# Patient Record
Sex: Male | Born: 1950 | Hispanic: No | Marital: Married | Smoking: Former smoker
Health system: Southern US, Community
[De-identification: ages and names within clinical notes are randomized; demographics above are authoritative.]

## PROBLEM LIST (undated history)

## (undated) DIAGNOSIS — U071 COVID-19: Secondary | ICD-10-CM

## (undated) DIAGNOSIS — E119 Type 2 diabetes mellitus without complications: Secondary | ICD-10-CM

## (undated) DIAGNOSIS — Z6831 Body mass index (BMI) 31.0-31.9, adult: Secondary | ICD-10-CM

## (undated) DIAGNOSIS — I10 Essential (primary) hypertension: Secondary | ICD-10-CM

---

## 2018-09-19 ENCOUNTER — Other Ambulatory Visit: Payer: Self-pay | Admitting: Physician Assistant

## 2018-09-19 DIAGNOSIS — Z87891 Personal history of nicotine dependence: Secondary | ICD-10-CM

## 2018-09-27 ENCOUNTER — Ambulatory Visit
Admission: RE | Admit: 2018-09-27 | Discharge: 2018-09-27 | Disposition: A | Discharge status: Home / Self Care | Payer: Medicaid Other | Source: Ambulatory Visit | Attending: Physician Assistant | Admitting: Physician Assistant

## 2018-09-27 DIAGNOSIS — Z87891 Personal history of nicotine dependence: Secondary | ICD-10-CM

## 2018-09-28 ENCOUNTER — Emergency Department (HOSPITAL_COMMUNITY): Payer: Medicaid Other

## 2018-09-28 ENCOUNTER — Emergency Department (HOSPITAL_COMMUNITY)
Admission: EM | Admit: 2018-09-28 | Discharge: 2018-09-29 | Disposition: A | Discharge status: Home / Self Care | Payer: Medicaid Other | Attending: Emergency Medicine | Admitting: Emergency Medicine

## 2018-09-28 ENCOUNTER — Encounter (HOSPITAL_COMMUNITY): Payer: Self-pay | Admitting: Emergency Medicine

## 2018-09-28 DIAGNOSIS — R51 Headache: Secondary | ICD-10-CM | POA: Diagnosis not present

## 2018-09-28 DIAGNOSIS — R519 Headache, unspecified: Secondary | ICD-10-CM

## 2018-09-28 DIAGNOSIS — Y999 Unspecified external cause status: Secondary | ICD-10-CM | POA: Insufficient documentation

## 2018-09-28 DIAGNOSIS — S0101XA Laceration without foreign body of scalp, initial encounter: Secondary | ICD-10-CM | POA: Diagnosis not present

## 2018-09-28 DIAGNOSIS — Y92009 Unspecified place in unspecified non-institutional (private) residence as the place of occurrence of the external cause: Secondary | ICD-10-CM | POA: Insufficient documentation

## 2018-09-28 DIAGNOSIS — I10 Essential (primary) hypertension: Secondary | ICD-10-CM | POA: Insufficient documentation

## 2018-09-28 DIAGNOSIS — W2209XA Striking against other stationary object, initial encounter: Secondary | ICD-10-CM | POA: Diagnosis not present

## 2018-09-28 DIAGNOSIS — E119 Type 2 diabetes mellitus without complications: Secondary | ICD-10-CM | POA: Diagnosis not present

## 2018-09-28 DIAGNOSIS — Z7984 Long term (current) use of oral hypoglycemic drugs: Secondary | ICD-10-CM | POA: Diagnosis not present

## 2018-09-28 DIAGNOSIS — Z87891 Personal history of nicotine dependence: Secondary | ICD-10-CM | POA: Insufficient documentation

## 2018-09-28 DIAGNOSIS — Y939 Activity, unspecified: Secondary | ICD-10-CM | POA: Insufficient documentation

## 2018-09-28 HISTORY — DX: Type 2 diabetes mellitus without complications: E11.9

## 2018-09-28 HISTORY — DX: Essential (primary) hypertension: I10

## 2018-09-28 NOTE — ED Triage Notes (Signed)
Reports hitting the left side of his head on the cabinet in his kitchen three days ago.  Was seen at [facility].  Reports they didn't give him any medicine and he is still having pain and can't sleep.  Small lac noted to left side of scalp that is well approximated and appears to be healing.

## 2018-09-28 NOTE — ED Provider Notes (Signed)
[facility] EMERGENCY DEPARTMENT Provider Note   CSN: [id number] Arrival date & time: 09/28/18  2216     History   Chief Complaint Chief Complaint  Patient presents with  . Head Laceration    HPI Dennis Mcdaniel is a 67 y.o. male with a hx of HTN, NIDDM presents to the Emergency Department complaining of gradual, persistent, headache onset 3 days ago after hitting his head on a cabinet. Described as stabbing and located only at the site of the injury without radiation.  He reports associated blurred vision in the left eye, nausea and dizziness (described as feeling lightheaded) onset today.  Associated symptoms include small laceration to the left scalp.  Pt cleaned wound with hydrogen peroxide at the time.  Pt reports he was seen at urgent care for this but was not given any pain medication.  No stitches placed.  Pt reports taking tylenol with complete resolution of headache, but then it returns when the medication wears off.  Pt is not on an anticoagulant.  Pt denies fever, chills, neck pain, neck stiffness, CP, SOB, abd pain, V/D, weakness, syncope, gait disturbance.       The history is provided by the patient and medical records. No language interpreter was used.    Past Medical History:  Diagnosis Date  . Diabetes mellitus without complication (HCC)   . Hypertension     There are no active problems to display for this patient.   History reviewed. No pertinent surgical history.      Home Medications    Prior to Admission medications   Medication Sig Start Date End Date Taking? Authorizing Provider  metFORMIN (GLUCOPHAGE) 500 MG tablet Take 500 mg by mouth 2 (two) times daily. 02/12/18  Yes [provider]    Family History No family history on file.  Social History Social History   Tobacco Use  . Smoking status: Former Games developer  . Smokeless tobacco: Never Used  Substance Use Topics  . Alcohol use: Never    Frequency: Never  .  Drug use: Never     Allergies   Pork allergy   Review of Systems Review of Systems  Constitutional: Negative for appetite change, diaphoresis, fatigue, fever and unexpected weight change.  HENT: Negative for mouth sores.   Eyes: Positive for visual disturbance.  Respiratory: Negative for cough, chest tightness, shortness of breath and wheezing.   Cardiovascular: Negative for chest pain.  Gastrointestinal: Positive for nausea. Negative for abdominal pain, constipation, diarrhea and vomiting.  Endocrine: Negative for polydipsia, polyphagia and polyuria.  Genitourinary: Negative for dysuria, frequency, hematuria and urgency.  Musculoskeletal: Negative for back pain and neck stiffness.  Skin: Positive for wound. Negative for rash.  Allergic/Immunologic: Negative for immunocompromised state.  Neurological: Positive for dizziness and headaches. Negative for syncope and light-headedness.  Hematological: Does not bruise/bleed easily.  Psychiatric/Behavioral: Negative for sleep disturbance. The patient is not nervous/anxious.      Physical Exam Updated Vital Signs BP 129/68 (BP Location: Right Arm)   Pulse 67   Temp 98.3 F (36.8 C) (Oral)   Resp 18   Ht 5\' 6"  (1.676 m)   Wt 81.2 kg   SpO2 99%   BMI 28.89 kg/m   Physical Exam  Constitutional: He is oriented to person, place, and time. He appears well-developed and well-nourished. No distress.  HENT:  Head: Normocephalic.  Mouth/Throat: Oropharynx is clear and moist.  1.0 cm laceration to the left occiput, healing without erythema, increased warmth, drainage from  the wound.   Eyes: Pupils are equal, round, and reactive to light. Conjunctivae and EOM are normal. No scleral icterus.  No horizontal, vertical or rotational nystagmus  Neck: Normal range of motion. Neck supple.  Full active and passive ROM without pain No midline or paraspinal tenderness No nuchal rigidity or meningeal signs  Cardiovascular: Normal rate, regular  rhythm and intact distal pulses.  Pulmonary/Chest: Effort normal and breath sounds normal. No respiratory distress. He has no wheezes. He has no rales.  Abdominal: Soft. Bowel sounds are normal. There is no tenderness. There is no rebound and no guarding.  Musculoskeletal: Normal range of motion.  Lymphadenopathy:    He has no cervical adenopathy.  Neurological: He is alert and oriented to person, place, and time. No cranial nerve deficit. He exhibits normal muscle tone. Coordination normal.  Mental Status:  Alert, oriented, thought content appropriate. Speech fluent without evidence of aphasia. Able to follow 2 step commands without difficulty.  Cranial Nerves:  II:  Peripheral visual fields grossly normal, pupils equal, round, reactive to light III,IV, VI: ptosis not present, extra-ocular motions intact bilaterally  V,VII: smile symmetric, facial light touch sensation equal VIII: hearing grossly normal bilaterally  IX,X: midline uvula rise  XI: bilateral shoulder shrug equal and strong XII: midline tongue extension  Motor:  5/5 in upper and lower extremities bilaterally including strong and equal grip strength and dorsiflexion/plantar flexion Sensory: Pinprick and light touch normal in all extremities.  Cerebellar: normal finger-to-nose with bilateral upper extremities Gait: normal gait and balance CV: distal pulses palpable throughout   Skin: Skin is warm and dry. No rash noted. He is not diaphoretic.  Psychiatric: He has a normal mood and affect. His behavior is normal. Judgment and thought content normal.  Nursing note and vitals reviewed.    ED Treatments / Results   Radiology Ct Head Wo Contrast  Result Date: 09/29/2018 CLINICAL DATA:  Head trauma, minor, GCS>=13, high clinical risk, initial exam. Blurred vision, nausea, dizziness. Struck left side of head cabinet 3 days ago. EXAM: CT HEAD WITHOUT CONTRAST TECHNIQUE: Contiguous axial images were obtained from the base of the  skull through the vertex without intravenous contrast. COMPARISON:  None. FINDINGS: Brain: Mild age related atrophy. No intracranial hemorrhage, mass effect, or midline shift. No hydrocephalus. The basilar cisterns are patent. No evidence of territorial infarct or acute ischemia. No extra-axial or intracranial fluid collection. Vascular: Hyperdense vessel. Skull: No fracture or focal lesion. Sinuses/Orbits: Mucosal thickening of the ethmoid air cells. Mucous retention cyst in the right maxillary sinus. No sinus fluid levels. Visualized orbits are unremarkable. Other: None. IMPRESSION: Normal for age atrophy. No acute intracranial abnormality. No skull fracture. Electronically Signed   By: Narda Rutherford M.D.   On: 09/29/2018 00:17   Procedures Procedures (including critical care time)  Medications Ordered in ED Medications  ketorolac (TORADOL) 30 MG/ML injection 30 mg (30 mg Intramuscular Given 09/29/18 0045)     Initial Impression / Assessment and Plan / ED Course  I have reviewed the triage vital signs and the nursing notes.  Pertinent labs & imaging results that were available during my care of the patient were reviewed by me and considered in my medical decision making (see chart for details).  Clinical Course as of Sep 29 150  Wynelle Link Sep 29, 2018  0101 Visual acuity: Bilateral Near: 10/16   R Near: 10/16  L Near: 10/20     [HM]    Clinical Course User Index [HM] Shyler Holzman, Dahlia Client,  PA-C    Pt presents with headache and laceration after minor head trauma.  He has had intermittent headache.  Normal neuro exam and pt ambulates with steady gait.  CT head without acute abnormality including ICH or skull fracture.  Pt reports blurred vision, but visual acuity is reassuring.  No nystagmus.  No diplopia.  Pt also with mild nausea and some dizziness.  Normal finger to nose.  No thunderclap headache.  Doubt SAH.  Pt is well appearing and headache improved with toradol.  Likely post concussive  nature.  Pt without hx of CKD or gastritis/PUD.  Will have patient alternate tylenol and motrin and follow-up with PCP in several days.    The patient was discussed with Dr. Judd Lien who agrees with the treatment plan.   Final Clinical Impressions(s) / ED Diagnoses   Final diagnoses:  Laceration of scalp, initial encounter  Left-sided headache    ED Discharge Orders    None       Milta Deiters 09/29/18 1610    Geoffery Lyons, MD 09/29/18 [phone]

## 2018-09-28 NOTE — ED Notes (Signed)
Taken to CT at this time. 

## 2018-09-29 MED ORDER — KETOROLAC TROMETHAMINE 30 MG/ML IJ SOLN
30.0000 mg | Freq: Once | INTRAMUSCULAR | Status: AC
  Administered 2018-09-29: 30 mg via INTRAMUSCULAR
  Filled 2018-09-29: qty 1

## 2018-09-29 NOTE — Discharge Instructions (Addendum)
1. Medications: alternate tylenol and ibuprofen, usual home medications 2. Treatment: rest, drink plenty of fluids,  3. Follow Up: Please followup with your primary doctor in 3-5 days for discussion of your diagnoses and further evaluation after today's visit; if you do not have a primary care doctor use the resource guide provided to find one; Please return to the ER for worsening symptoms, vomiting, changes in vision or other concerns

## 2019-04-14 ENCOUNTER — Encounter (HOSPITAL_COMMUNITY): Payer: Self-pay

## 2019-04-14 ENCOUNTER — Other Ambulatory Visit: Payer: Self-pay

## 2019-04-14 ENCOUNTER — Emergency Department (HOSPITAL_COMMUNITY)
Admission: EM | Admit: 2019-04-14 | Discharge: 2019-04-15 | Disposition: A | Discharge status: Home / Self Care | Payer: Medicaid Other | Attending: Emergency Medicine | Admitting: Emergency Medicine

## 2019-04-14 DIAGNOSIS — M546 Pain in thoracic spine: Secondary | ICD-10-CM | POA: Insufficient documentation

## 2019-04-14 DIAGNOSIS — Z87891 Personal history of nicotine dependence: Secondary | ICD-10-CM | POA: Diagnosis not present

## 2019-04-14 DIAGNOSIS — W11XXXA Fall on and from ladder, initial encounter: Secondary | ICD-10-CM | POA: Diagnosis not present

## 2019-04-14 DIAGNOSIS — I10 Essential (primary) hypertension: Secondary | ICD-10-CM | POA: Diagnosis not present

## 2019-04-14 DIAGNOSIS — M79671 Pain in right foot: Secondary | ICD-10-CM | POA: Insufficient documentation

## 2019-04-14 DIAGNOSIS — M545 Low back pain: Secondary | ICD-10-CM | POA: Insufficient documentation

## 2019-04-14 DIAGNOSIS — E119 Type 2 diabetes mellitus without complications: Secondary | ICD-10-CM | POA: Diagnosis not present

## 2019-04-14 DIAGNOSIS — Z7984 Long term (current) use of oral hypoglycemic drugs: Secondary | ICD-10-CM | POA: Diagnosis not present

## 2019-04-14 NOTE — ED Triage Notes (Signed)
Pt arrives to ED with c/o back pain due to fall at work on last Tuesday; pt a&ox 4 on arrival and able to [facility]

## 2019-04-15 ENCOUNTER — Emergency Department (HOSPITAL_COMMUNITY): Payer: Medicaid Other

## 2019-04-15 MED ORDER — OXYCODONE-ACETAMINOPHEN 5-325 MG PO TABS: 1.0000 | ORAL_TABLET | ORAL | 0 refills | Status: DC | PRN

## 2019-04-15 NOTE — ED Provider Notes (Signed)
[facility] Provider [facility]   CSN: [id number] Arrival date & time: 04/14/19  2137    History   Chief Complaint Chief Complaint  Patient presents with  . Back Pain  . Fall    HPI Dennis Mcdaniel is a 68 y.o. male.     The history is provided by the patient and medical records.  Back Pain  Fall      68 y.o. M with hx of HTN, DM, presenting to the ED after a fall that occurred 1 week ago.  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  States he slight hit his head on nearby shelf but no LOC.  States over the past week he has had progressively worsening low back pain, more so lower spine but also somewhat along mid-back and right foot.  He denies numbness/weakness of his arms/legs.  No bowel or bladder incontinence.  He states he is walking around, but causing him pain when doing so.  He has not been taking medications at home.  No prior history of back injuries or surgeries in the past.  No headaches, confusion, changes in speech, blurred vision, etc.  He is not on anticoagulation.  Past Medical History:  Diagnosis Date  . Diabetes mellitus without complication (HCC)   . Hypertension     There are no active problems to display for this patient.   History reviewed. No pertinent surgical history.      Home Medications    Prior to Admission medications   Medication Sig Start Date End Date Taking? Authorizing Provider  metFORMIN (GLUCOPHAGE) 500 MG tablet Take 500 mg by mouth 2 (two) times daily. 02/12/18   [provider]    Family History History reviewed. No pertinent family history.  Social History Social History   Tobacco Use  . Smoking status: Former Games developermoker  . Smokeless tobacco: Never Used  Substance Use Topics  . Alcohol use: Never    Frequency: Never  . Drug use: Never     Allergies   Pork allergy   Review of Systems Review of Systems  Musculoskeletal: Positive for back pain.     Physical Exam Updated Vital Signs BP (!) 159/92 (BP Location: Right Arm)   Pulse (!) 101   Temp 98.6 F (37 C) (Oral)   Resp 18   SpO2 97%   Physical Exam Vitals signs and nursing note reviewed.  Constitutional:      Appearance: He is well-developed.  HENT:     Head: Normocephalic and atraumatic.     Comments: No visible signs of head trauma Eyes:     Conjunctiva/sclera: Conjunctivae normal.     Pupils: Pupils are equal, round, and reactive to light.  Neck:     Musculoskeletal: Normal range of motion.  Cardiovascular:     Rate and Rhythm: Normal rate and regular rhythm.     Heart sounds: Normal heart sounds.  Pulmonary:     Effort: Pulmonary effort is normal.     Breath sounds: Normal breath sounds.  Abdominal:     General: Bowel sounds are normal.     Palpations: Abdomen is soft.  Musculoskeletal: Normal range of motion.     Comments: Tenderness of sacrum and lumbar spine, somewhat to include the right lumbar paraspinal region; there is no appreciable midline deformity or bruising Mild tenderness of right thoracic paraspinal region; no  acute deformity or bruising noted Right foot normal in appearance, reports pain along dorsum Extremity pulses intact x4, remains ambulatory with normal strength/sensation throughout  Skin:    General: Skin is warm and dry.  Neurological:     Mental Status: He is alert and oriented to person, place, and time.     Comments: AAOx3, answering questions and following commands appropriately; equal strength UE and LE bilaterally; CN grossly intact; moves all extremities appropriately without ataxia; no focal neuro deficits or facial asymmetry appreciated      ED Treatments / Results  Labs (all labs ordered are listed, but only abnormal results are displayed) Labs Reviewed - No data to display  EKG None  Radiology Dg Thoracic Spine 2 View  Result  Date: 04/15/2019 CLINICAL DATA:  Fall from ladder EXAM: THORACIC SPINE 2 VIEWS COMPARISON:  None. FINDINGS: There is no evidence of thoracic spine fracture. Alignment is normal. No other significant bone abnormalities are identified. IMPRESSION: Negative. Electronically Signed   By: Deatra Robinson M.D.   On: 04/15/2019 01:19   Dg Lumbar Spine Complete  Result Date: 04/15/2019 CLINICAL DATA:  Fall from ladder EXAM: LUMBAR SPINE - COMPLETE 4+ VIEW COMPARISON:  None. FINDINGS: There is no evidence of lumbar spine fracture. Alignment is normal. Intervertebral disc spaces are maintained. IMPRESSION: Negative. Electronically Signed   By: Deatra Robinson M.D.   On: 04/15/2019 01:20   Dg Sacrum/coccyx  Result Date: 04/15/2019 CLINICAL DATA:  Fall from ladder EXAM: SACRUM AND COCCYX - 2+ VIEW COMPARISON:  None. FINDINGS: There is no evidence of fracture or other focal bone lesions. IMPRESSION: Negative. Electronically Signed   By: Deatra Robinson M.D.   On: 04/15/2019 01:18   Dg Foot Complete Right  Result Date: 04/15/2019 CLINICAL DATA:  Fall from ladder EXAM: RIGHT FOOT COMPLETE - 3+ VIEW COMPARISON:  None. FINDINGS: There is no evidence of fracture or dislocation. There is no evidence of arthropathy or other focal bone abnormality. Soft tissues are unremarkable. IMPRESSION: Negative. Electronically Signed   By: Deatra Robinson M.D.   On: 04/15/2019 01:19    Procedures Procedures (including critical care time)  Medications Ordered in ED Medications - No data to display   Initial Impression / Assessment and Plan / ED Course  I have reviewed the triage vital signs and the nursing notes.  Pertinent labs & imaging results that were available during my care of the patient were reviewed by me and considered in my medical decision making (see chart for details).  68 y.o. M here after fall from 20ft ladder almost a week ago.  Patient states he lost his footing, fell backwards onto concrete floor.  Struck head  slightly against nearby shelf but no LOC.  Reports ongoing back in the tailbone, low and mid-back, and right foot.  Some areas of tenderness on exam but no acute deformities.  No visible signs of head trauma.  No focal neurologic deficits.  Remains ambulatory.  Plan for screening x-rays.  X-rays all negative.  Patient remains without focal neurologic deficits.  No red flag symptoms concerning for cauda equina.  Continues ambulating here in the ED.  Feel he is stable for discharge home with pain control.  Given work-note.  Follow-up with PCP.  Return here for any new/acute changes.  Final Clinical Impressions(s) / ED Diagnoses   Final diagnoses:  Fall from ladder, initial encounter    ED Discharge Orders         Ordered    oxyCODONE-acetaminophen (  PERCOCET) 5-325 MG tablet  Every 4 hours PRN     04/15/19 0149           Garlon Hatchet, PA-C 04/15/19 0448    Nira Conn, MD 04/15/19 [phone]

## 2019-04-15 NOTE — ED Notes (Signed)
Patient transported to X-ray 

## 2019-04-15 NOTE — ED Notes (Signed)
ED Provider at bedside. 

## 2019-04-15 NOTE — Discharge Instructions (Signed)
Take the prescribed medication as directed.  Do not drive or use heavy machinery while taking this medication.  Can take motrin between doses if needed for extra pain relief.  Can use ice/heat too. Follow-up with your primary care doctor. Return to the ED for new or worsening symptoms.

## 2019-07-08 IMAGING — CR SACRUM AND COCCYX - 2+ VIEW
3 series · 3 of 3 positions shown · non-contrast
Comparison: None.

CLINICAL DATA: Fall from ladder

EXAM:
SACRUM AND COCCYX - 2+ VIEW

[coccyx ap]
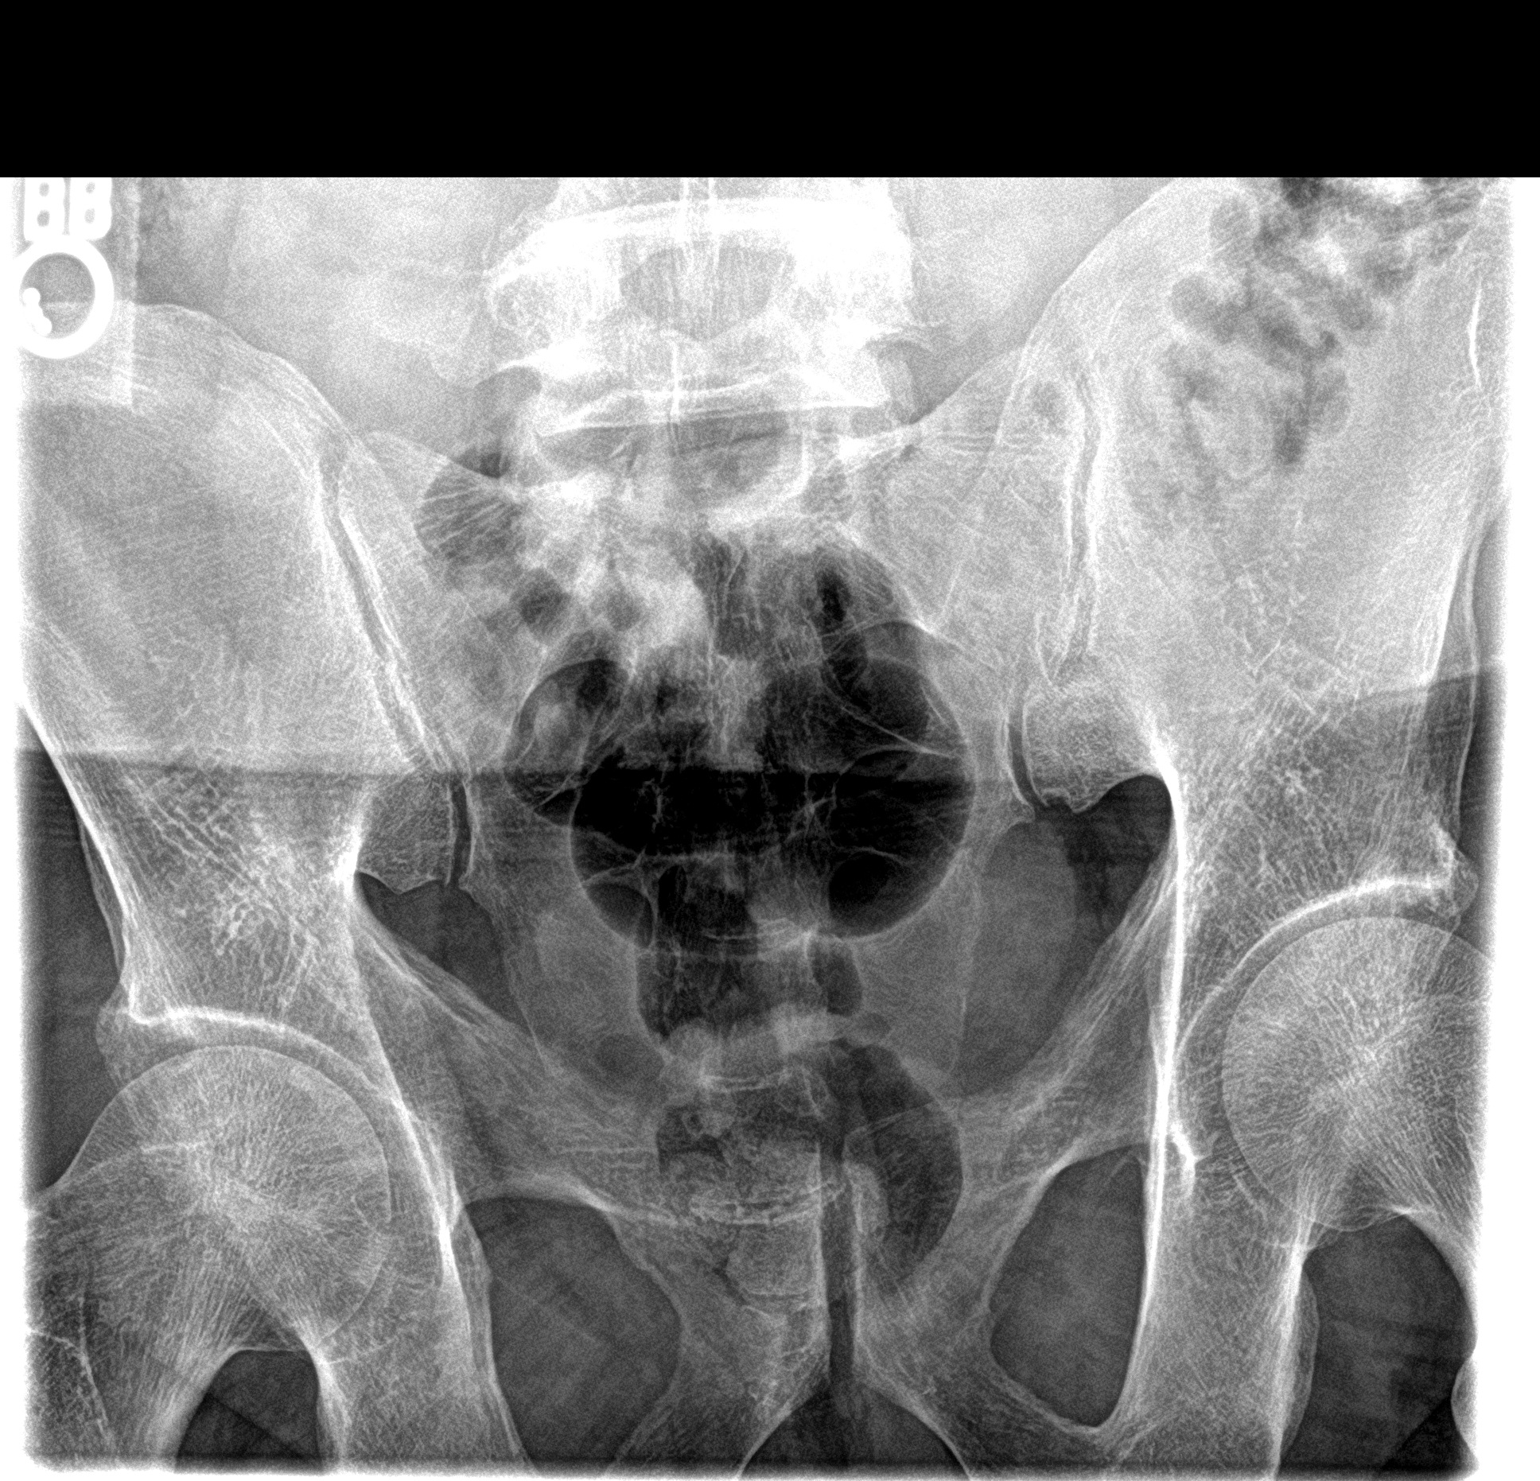

[sacrum ap]
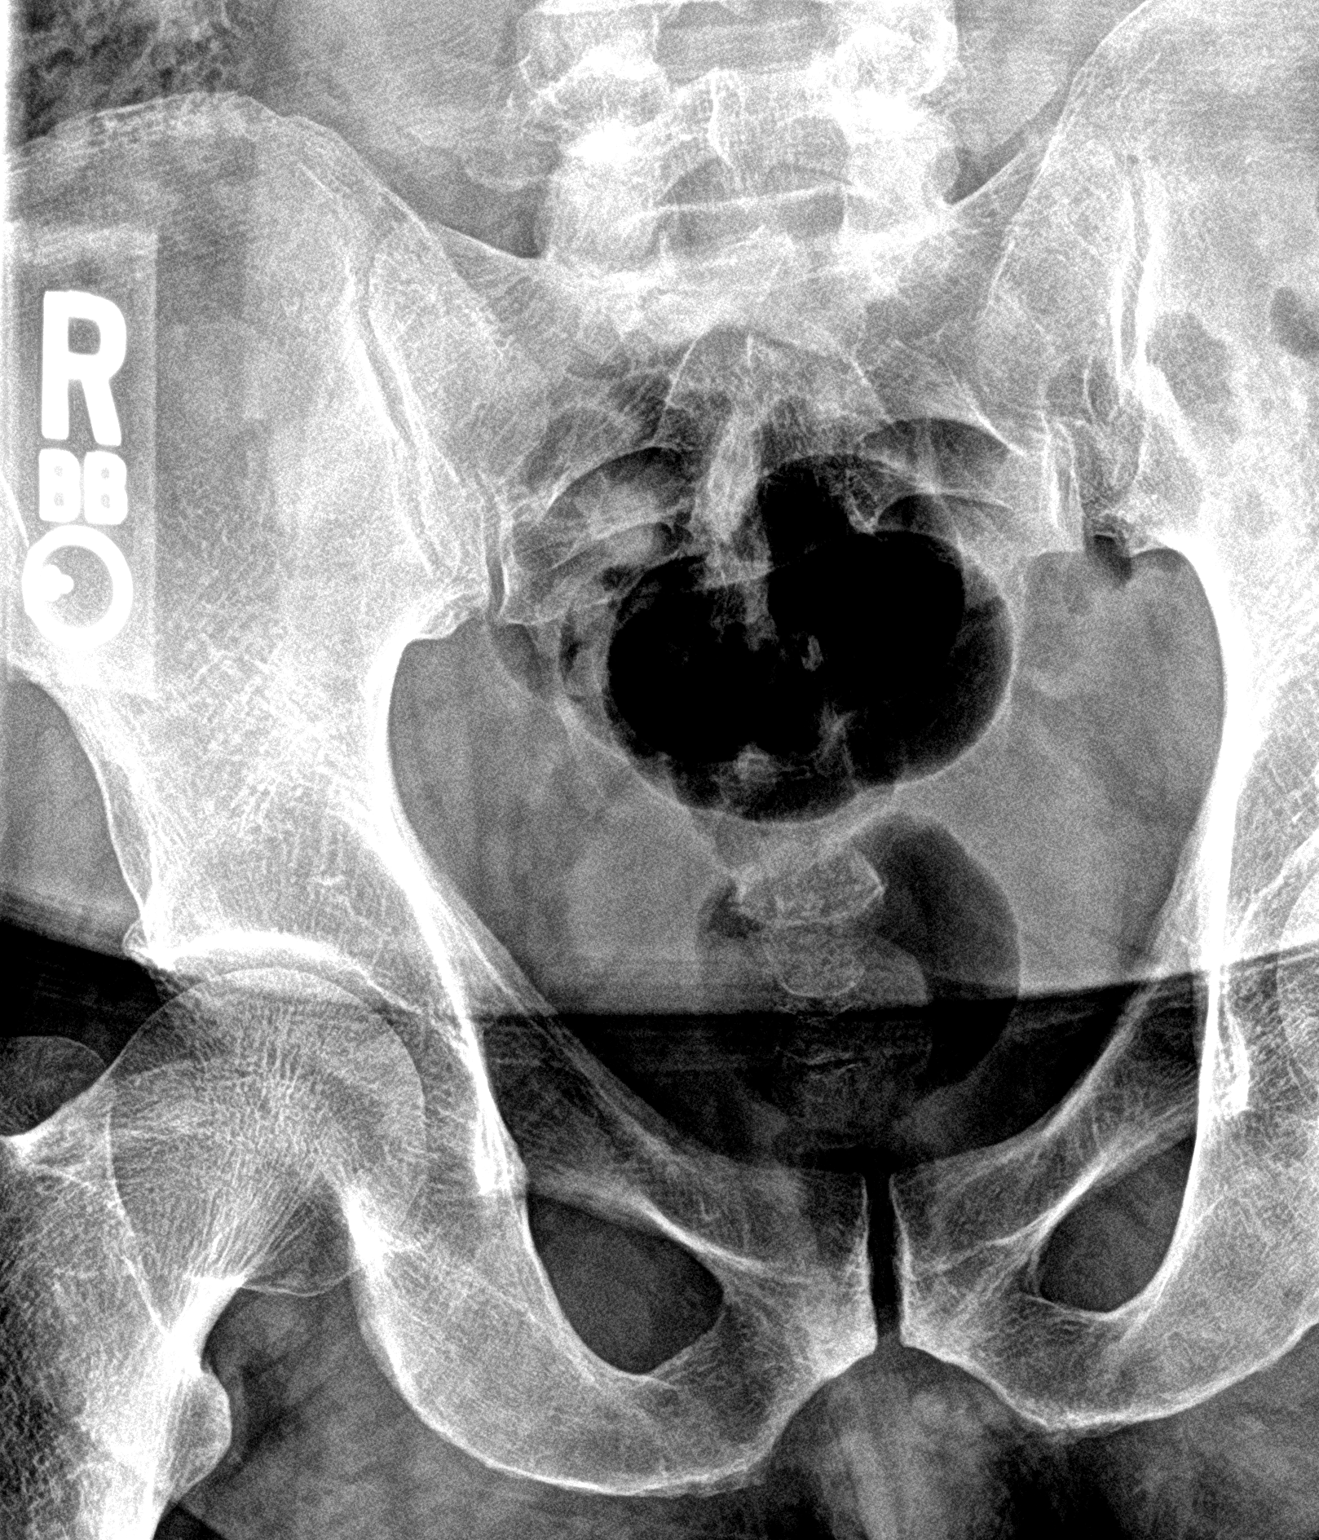

[sacrum lat]
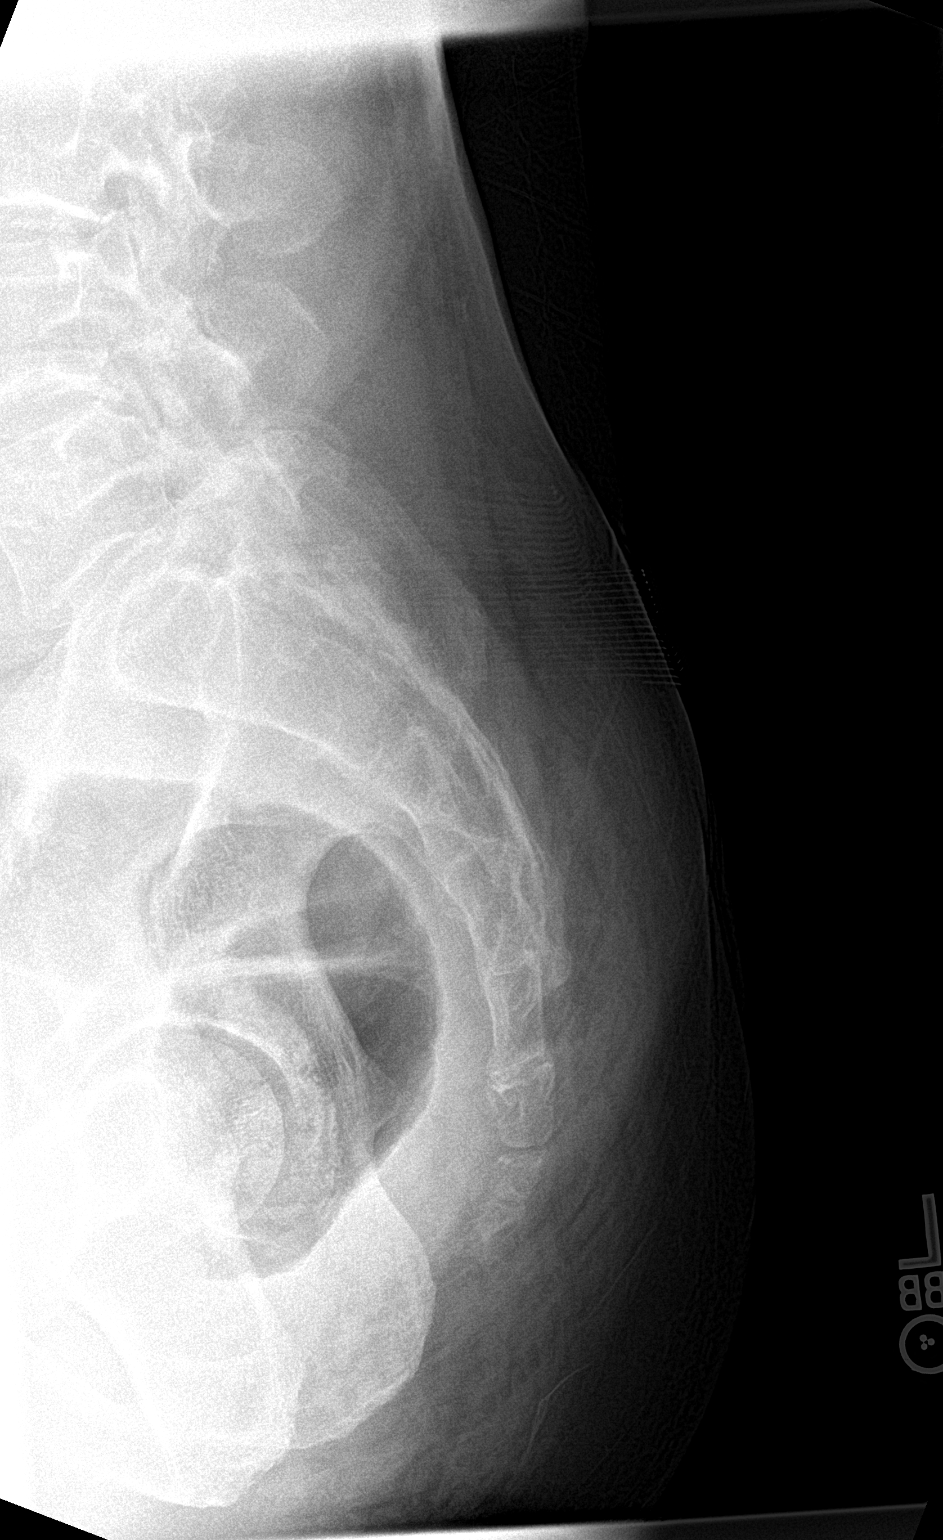

[3 of 3 positions shown; findings below may reference images not displayed]

FINDINGS: There is no evidence of fracture or other focal bone lesions.
IMPRESSION: Negative.

## 2019-07-08 IMAGING — CR THORACIC SPINE 2 VIEWS
3 series · 3 of 3 positions shown · non-contrast
Comparison: None.

CLINICAL DATA: Fall from ladder

EXAM:
THORACIC SPINE 2 VIEWS

[t-spine ap]
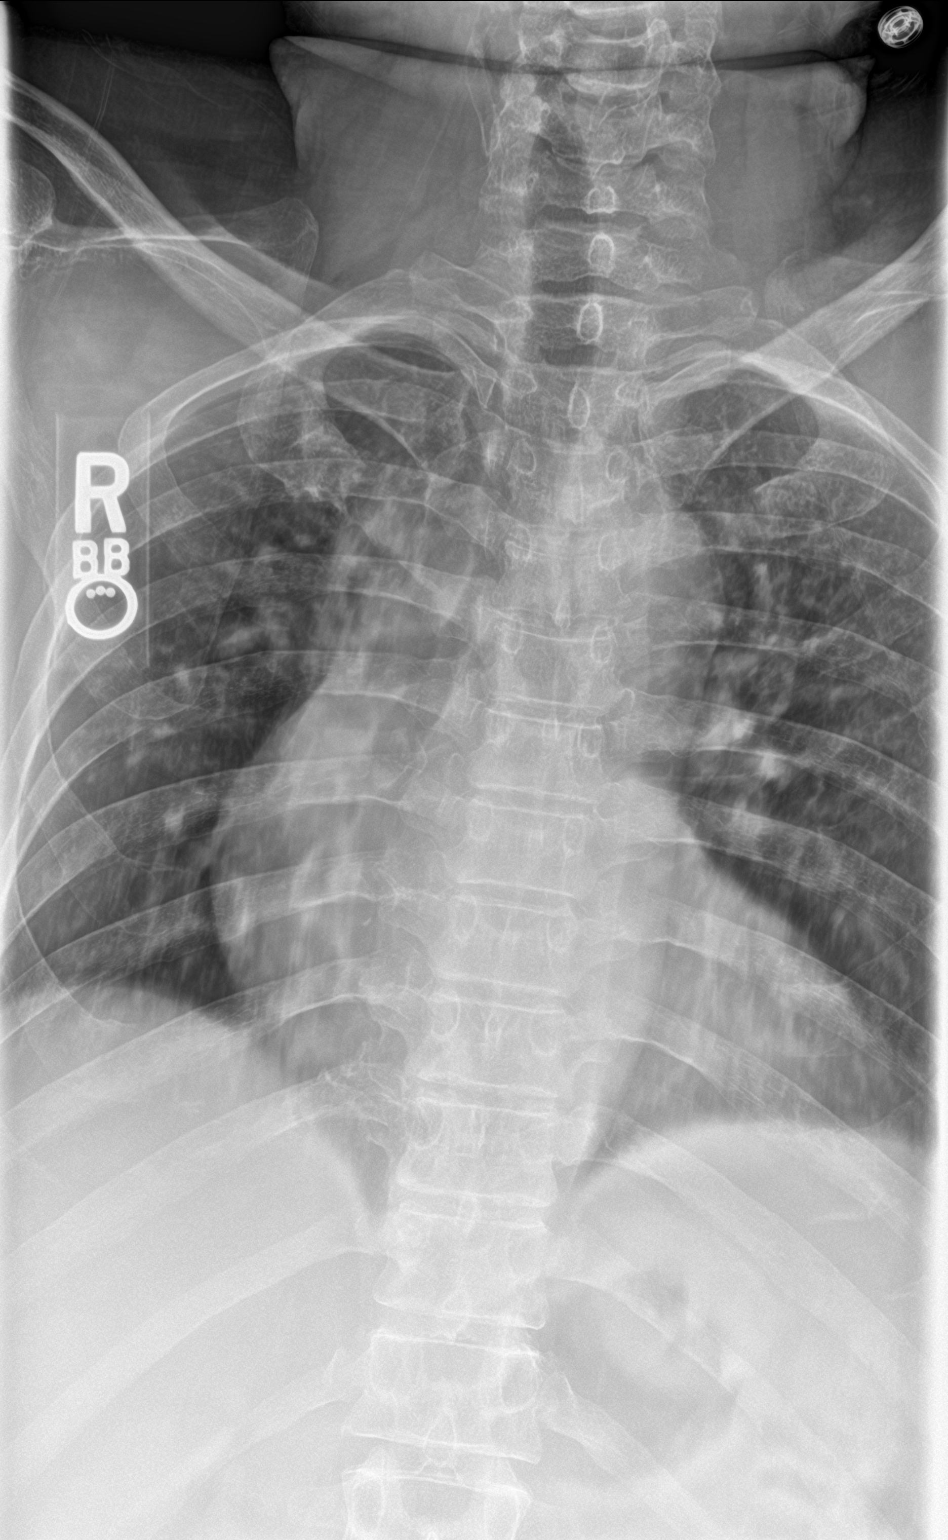

[t-spine lat]
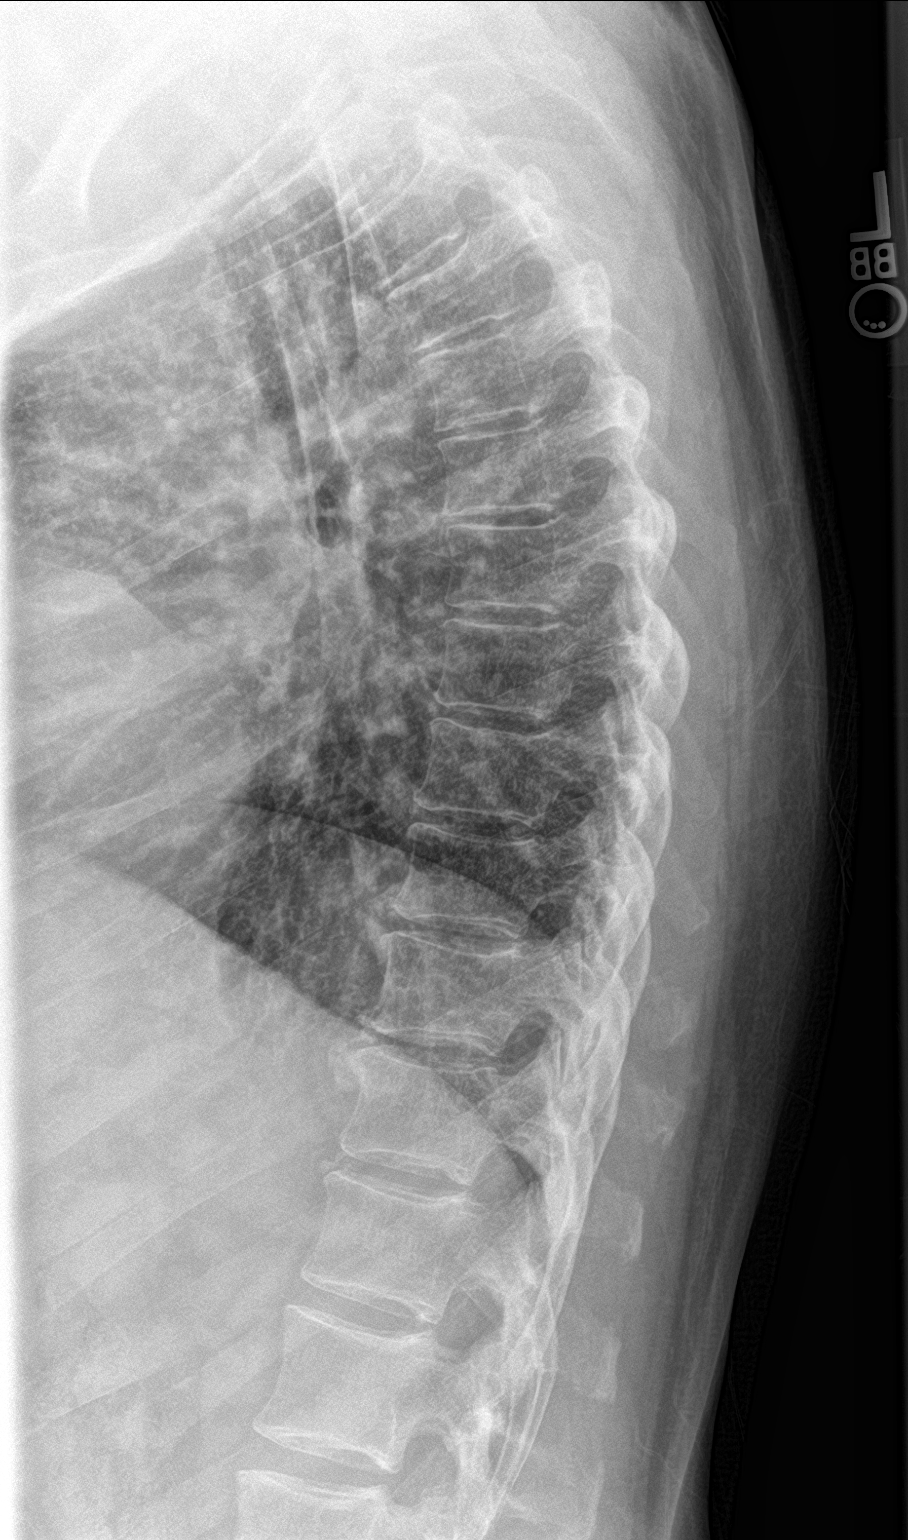

[t-spine swimmers]
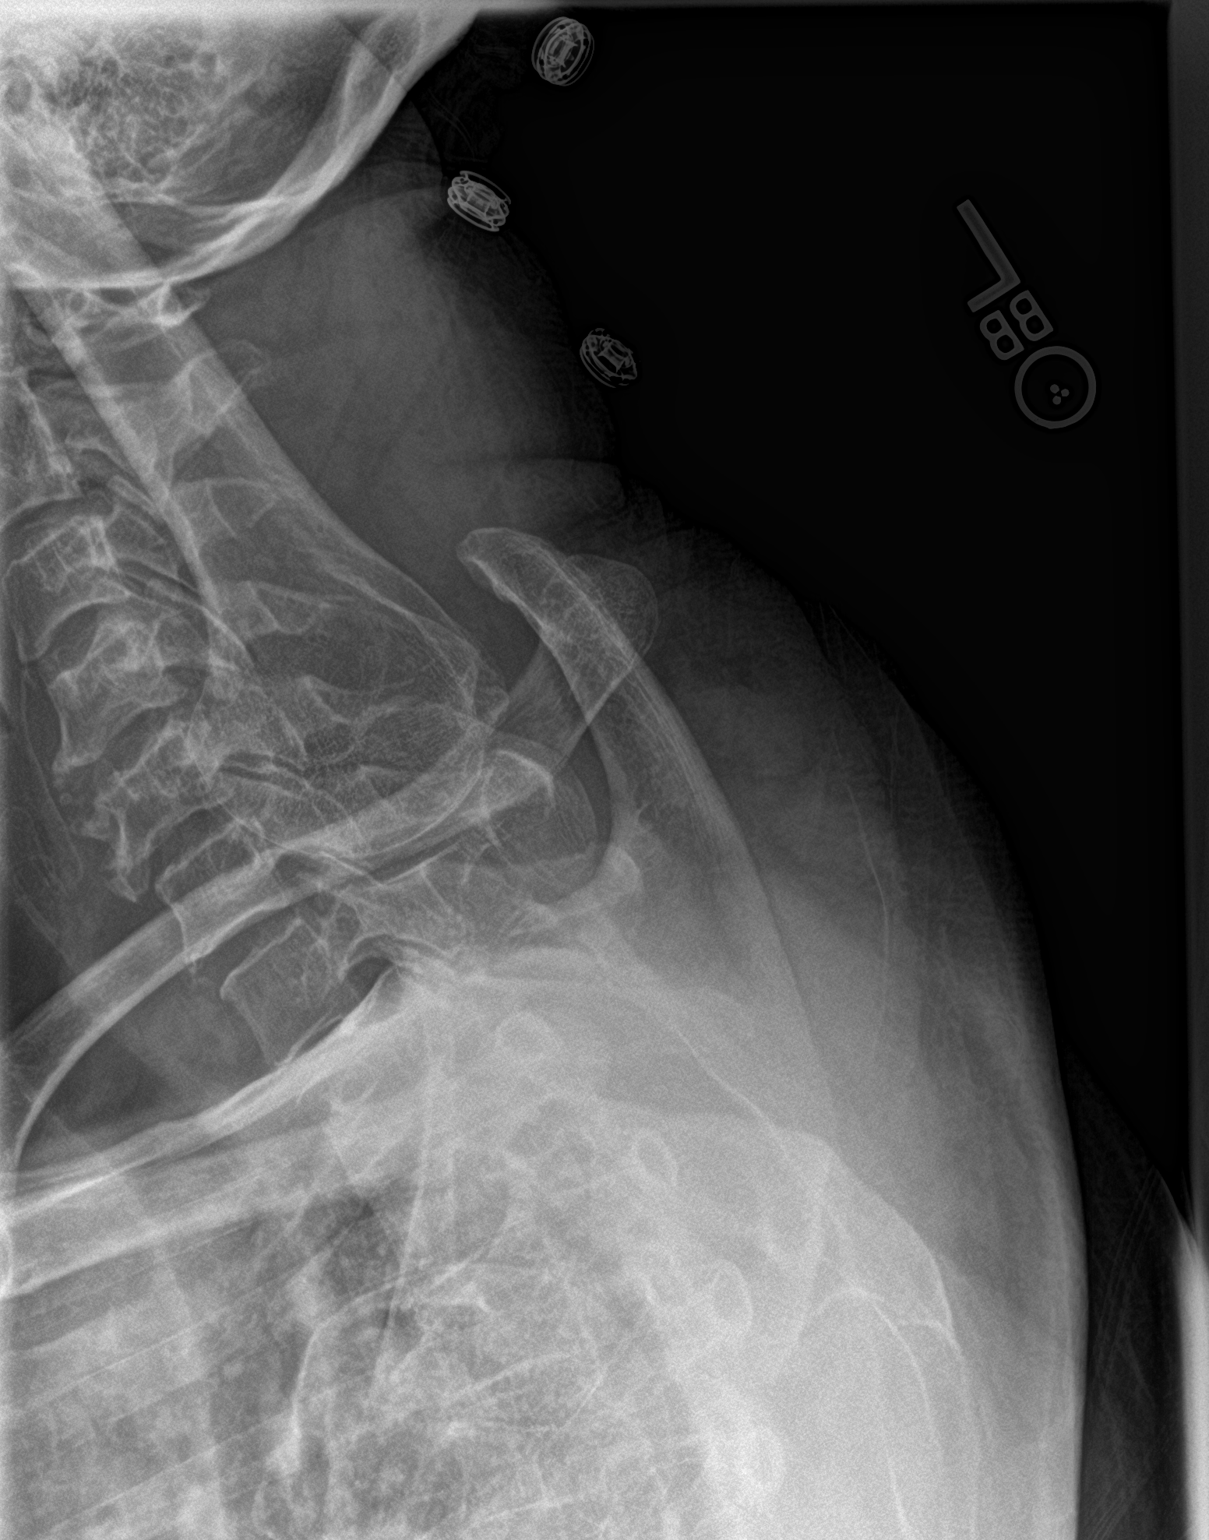

[3 of 3 positions shown; findings below may reference images not displayed]

FINDINGS: There is no evidence of thoracic spine fracture. Alignment is
normal. No other significant bone abnormalities are identified.
IMPRESSION: Negative.

## 2020-12-28 ENCOUNTER — Emergency Department (HOSPITAL_COMMUNITY)
Admission: EM | Admit: 2020-12-28 | Discharge: 2020-12-28 | Disposition: A | Discharge status: Home / Self Care | Payer: Medicaid Other | Attending: Emergency Medicine | Admitting: Emergency Medicine

## 2020-12-28 ENCOUNTER — Other Ambulatory Visit: Payer: Self-pay

## 2020-12-28 ENCOUNTER — Encounter (HOSPITAL_COMMUNITY): Payer: Self-pay

## 2020-12-28 ENCOUNTER — Telehealth: Payer: Self-pay | Admitting: Physician Assistant

## 2020-12-28 DIAGNOSIS — I10 Essential (primary) hypertension: Secondary | ICD-10-CM | POA: Diagnosis not present

## 2020-12-28 DIAGNOSIS — Z87891 Personal history of nicotine dependence: Secondary | ICD-10-CM | POA: Insufficient documentation

## 2020-12-28 DIAGNOSIS — E119 Type 2 diabetes mellitus without complications: Secondary | ICD-10-CM | POA: Diagnosis not present

## 2020-12-28 DIAGNOSIS — R509 Fever, unspecified: Secondary | ICD-10-CM | POA: Diagnosis present

## 2020-12-28 DIAGNOSIS — U071 COVID-19: Secondary | ICD-10-CM | POA: Diagnosis not present

## 2020-12-28 DIAGNOSIS — N401 Enlarged prostate with lower urinary tract symptoms: Secondary | ICD-10-CM | POA: Diagnosis not present

## 2020-12-28 DIAGNOSIS — R3911 Hesitancy of micturition: Secondary | ICD-10-CM

## 2020-12-28 DIAGNOSIS — Z7984 Long term (current) use of oral hypoglycemic drugs: Secondary | ICD-10-CM | POA: Diagnosis not present

## 2020-12-28 HISTORY — DX: COVID-19: U07.1

## 2020-12-28 HISTORY — DX: Body mass index (BMI) 31.0-31.9, adult: Z68.31

## 2020-12-28 LAB — COMPREHENSIVE METABOLIC PANEL
ALT: 15 U/L (ref 0–44)
AST: 14 U/L — ABNORMAL LOW (ref 15–41)
Albumin: 3.7 g/dL (ref 3.5–5.0)
Alkaline Phosphatase: 60 U/L (ref 38–126)
Anion gap: 9 (ref 5–15)
BUN: 10 mg/dL (ref 8–23)
CO2: 24 mmol/L (ref 22–32)
Calcium: 8.9 mg/dL (ref 8.9–10.3)
Chloride: 101 mmol/L (ref 98–111)
Creatinine, Ser: 1.06 mg/dL (ref 0.61–1.24)
GFR, Estimated: >60 mL/min (ref >60)
Glucose, Bld: 133 mg/dL — ABNORMAL HIGH (ref 70–99)
Potassium: 3.6 mmol/L (ref 3.5–5.1)
Sodium: 134 mmol/L — ABNORMAL LOW (ref 135–145)
Total Bilirubin: 0.7 mg/dL (ref 0.3–1.2)
Total Protein: 7 g/dL (ref 6.5–8.1)

## 2020-12-28 LAB — URINALYSIS, ROUTINE W REFLEX MICROSCOPIC
Bacteria, UA: NONE SEEN
Bilirubin Urine: NEGATIVE
Glucose, UA: NEGATIVE mg/dL
Ketones, ur: NEGATIVE mg/dL
Leukocytes,Ua: NEGATIVE
Nitrite: NEGATIVE
Protein, ur: NEGATIVE mg/dL
Specific Gravity, Urine: 1.009 (ref 1.005–1.030)
pH: 5 (ref 5.0–8.0)

## 2020-12-28 LAB — RESP PANEL BY RT-PCR (FLU A&B, COVID) ARPGX2
Influenza A by PCR: NEGATIVE
Influenza B by PCR: NEGATIVE
SARS Coronavirus 2 by RT PCR: POSITIVE — AB

## 2020-12-28 LAB — CBC
HCT: 44.9 % (ref 39.0–52.0)
Hemoglobin: 14.1 g/dL (ref 13.0–17.0)
MCH: 21.3 pg — ABNORMAL LOW (ref 26.0–34.0)
MCHC: 31.4 g/dL (ref 30.0–36.0)
MCV: 67.8 fL — ABNORMAL LOW (ref 80.0–100.0)
Platelets: 195 10*3/uL (ref 150–400)
RBC: 6.62 MIL/uL — ABNORMAL HIGH (ref 4.22–5.81)
RDW: 16.2 % — ABNORMAL HIGH (ref 11.5–15.5)
WBC: 6.8 10*3/uL (ref 4.0–10.5)
nRBC: 0 % (ref 0.0–0.2)

## 2020-12-28 LAB — LIPASE, BLOOD: Lipase: 25 U/L (ref 11–51)

## 2020-12-28 MED ORDER — ACETAMINOPHEN 500 MG PO TABS
1000.0000 mg | ORAL_TABLET | Freq: Once | ORAL | Status: AC
  Administered 2020-12-28: 1000 mg via ORAL
  Filled 2020-12-28: qty 2

## 2020-12-28 MED ORDER — ACETAMINOPHEN 325 MG PO TABS
650.0000 mg | ORAL_TABLET | Freq: Once | ORAL | Status: AC
  Administered 2020-12-28: 650 mg via ORAL
  Filled 2020-12-28: qty 2

## 2020-12-28 NOTE — ED Provider Notes (Signed)
[facility] EMERGENCY DEPARTMENT Provider Note   CSN: [id number] Arrival date & time: 12/28/20  1040     History Chief Complaint  Patient presents with  . Chills  . Fever  . Abdominal Pain    Dennis Mcdaniel is a 70 y.o. male.  The history is provided by the patient. The history is limited by a language barrier. A language interpreter was used.  Fever Temp source:  Subjective Severity:  Moderate Onset quality:  Sudden Duration:  1 day Timing:  Constant Progression:  Waxing and waning Chronicity:  New Relieved by:  None tried Worsened by:  Nothing Ineffective treatments:  None tried Associated symptoms: chills, cough and myalgias   Associated symptoms: no chest pain, no diarrhea, no nausea and no vomiting   Associated symptoms comment:  No SOB or abd pain.  No diarrhea Risk factors comment:  Unvaccinated.  hx of DM and HTN. Abdominal Pain Associated symptoms: chills, cough and fever   Associated symptoms: no chest pain, no diarrhea, no hematuria, no nausea and no vomiting        Past Medical History:  Diagnosis Date  . Diabetes mellitus without complication ([place])   . Hypertension     There are no problems to display for this patient.   History reviewed. No pertinent surgical history.     History reviewed. No pertinent family history.  Social History   Tobacco Use  . Smoking status: Former Research scientist (life sciences)  . Smokeless tobacco: Never Used  Substance Use Topics  . Alcohol use: Never  . Drug use: Never    Home Medications Prior to Admission medications   Medication Sig Start Date End Date Taking? Authorizing Provider  metFORMIN (GLUCOPHAGE) 500 MG tablet Take 500 mg by mouth 2 (two) times daily. 02/12/18   [provider]  oxyCODONE-acetaminophen (PERCOCET) 5-325 MG tablet Take 1 tablet by mouth every 4 (four) hours as needed. 04/15/19   Larene Pickett, PA-C    Allergies    Pork allergy and Ibuprofen  Review of Systems    Review of Systems  Constitutional: Positive for chills and fever.  Respiratory: Positive for cough.   Cardiovascular: Negative for chest pain.  Gastrointestinal: Positive for abdominal pain. Negative for diarrhea, nausea and vomiting.  Genitourinary: Positive for difficulty urinating. Negative for hematuria.       For 1 month trouble with urinating.  Has to strain to get urine stream started and then improves once going.  No dysuria but does have some frequency.  Musculoskeletal: Positive for myalgias.  All other systems reviewed and are negative.   Physical Exam Updated Vital Signs BP 131/73   Pulse 98   Temp 98.8 F (37.1 C) (Oral)   Resp 20   SpO2 94%   Physical Exam Vitals and nursing note reviewed.  Constitutional:      General: He is not in acute distress.    Appearance: He is well-developed, normal weight and well-nourished.  HENT:     Head: Normocephalic and atraumatic.     Mouth/Throat:     Mouth: Oropharynx is clear and moist.  Eyes:     Extraocular Movements: EOM normal.     Conjunctiva/sclera: Conjunctivae normal.     Pupils: Pupils are equal, round, and reactive to light.  Cardiovascular:     Rate and Rhythm: Normal rate and regular rhythm.     Pulses: Intact distal pulses.     Heart sounds: No murmur heard.   Pulmonary:     Effort:  Pulmonary effort is normal. No respiratory distress.     Breath sounds: Rales present. No wheezing.     Comments: Fine crackles present at the right base Abdominal:     General: There is no distension.     Palpations: Abdomen is soft.     Tenderness: There is no abdominal tenderness. There is no guarding or rebound.  Musculoskeletal:        General: No tenderness or edema. Normal range of motion.     Cervical back: Normal range of motion and neck supple.     Right lower leg: No edema.     Left lower leg: No edema.  Skin:    General: Skin is warm and dry.     Findings: No erythema or rash.  Neurological:     General:  No focal deficit present.     Mental Status: He is alert and oriented to person, place, and time. Mental status is at baseline.  Psychiatric:        Mood and Affect: Mood and affect and mood normal.        Behavior: Behavior normal.        Thought Content: Thought content normal.      ED Results / Procedures / Treatments   Labs (all labs ordered are listed, but only abnormal results are displayed) Labs Reviewed  RESP PANEL BY RT-PCR (FLU A&B, COVID) ARPGX2 - Abnormal; Notable for the following components:      Result Value   SARS Coronavirus 2 by RT PCR POSITIVE (*)    All other components within normal limits  COMPREHENSIVE METABOLIC PANEL - Abnormal; Notable for the following components:   Sodium 134 (*)    Glucose, Bld 133 (*)    AST 14 (*)    All other components within normal limits  CBC - Abnormal; Notable for the following components:   RBC 6.62 (*)    MCV 67.8 (*)    MCH 21.3 (*)    RDW 16.2 (*)    All other components within normal limits  URINALYSIS, ROUTINE W REFLEX MICROSCOPIC - Abnormal; Notable for the following components:   Hgb urine dipstick MODERATE (*)    All other components within normal limits  LIPASE, BLOOD    EKG None  Radiology No results found.  Procedures Procedures (including critical care time)  Medications Ordered in ED Medications  acetaminophen (TYLENOL) tablet 650 mg (650 mg Oral Given 12/28/20 1134)    ED Course  I have reviewed the triage vital signs and the nursing notes.  Pertinent labs & imaging results that were available during my care of the patient were reviewed by me and considered in my medical decision making (see chart for details).    MDM Rules/Calculators/A&P                          Pt with symptoms consistent with covid like illness.  Well appearing here but febrile.  No signs of breathing difficulty  No signs of pharyngitis or abnormal abdominal findings.   Labs are reassuring with normal lipase, CMP, CBC and  urine.  He is COVID positive.  Patient is also complaining of urinary symptoms more consistent to BPH.  UA is negative.  Discussed with patient at this time will refer to the monoclonal antibody clinic but unsure if he will be able to receive the infusion due to the short supply.  Also gave strict return precautions.  As for his  urinary issues encouraged him to follow-up with PCP in the future once he is improved from Covid so that he can have this addressed and be treated for BPH if that is the case.  MDM Number of Diagnoses or Management Options   Amount and/or Complexity of Data Reviewed Clinical lab tests: ordered and reviewed Independent visualization of images, tracings, or specimens: yes  Dennis Mcdaniel was evaluated in Emergency Department on 12/28/2020 for the symptoms described in the history of present illness. He was evaluated in the context of the global COVID-19 pandemic, which necessitated consideration that the patient might be at risk for infection with the SARS-CoV-2 virus that causes COVID-19. Institutional protocols and algorithms that pertain to the evaluation of patients at risk for COVID-19 are in a state of rapid change based on information released by regulatory bodies including the CDC and federal and state organizations. These policies and algorithms were followed during the patient's care in the ED.  Final Clinical Impression(s) / ED Diagnoses Final diagnoses:  COVID  Benign prostatic hyperplasia with urinary hesitancy    Rx / DC Orders ED Discharge Orders    None       Gwyneth Sprout, MD 12/28/20 1650

## 2020-12-28 NOTE — ED Triage Notes (Signed)
Pt now endorses bladder pain x1 week, burning sensation with urination

## 2020-12-28 NOTE — ED Triage Notes (Signed)
Interpreter Dennis Mcdaniel #[id number]  Pt reports after cleaning his car in the cold yesterday he began feeling cold, fever, ear pain and fatigued.   Pt has not been around anyone covid +  Pt has not been vaccinated

## 2020-12-28 NOTE — Telephone Encounter (Signed)
Called to discuss with patient about COVID-19 symptoms and the use of one of the available treatments for those with mild to moderate Covid symptoms and at a high risk of hospitalization.  Pt appears to qualify for outpatient treatment due to co-morbid conditions and/or a member of an at-risk group in accordance with the FDA Emergency Use Authorization.    Symptom onset: 1/2 Vaccinated: no Qualifiers: BMI 31, DMT2, HTN  Unable to reach pt - left VM with an Arabic interpreter to call my work cell back if interested in available treatment options.   Cline Crock

## 2020-12-28 NOTE — Discharge Instructions (Signed)
You have COVID and you have be referred to the infusion clinic to get a medication that will hopefully improve your symptoms and decrease length of illness.  However in the meantime if you develop any breathing trouble, persistent vomiting, significant weakness or other concerns return to the emergency room.

## 2022-03-07 ENCOUNTER — Encounter (HOSPITAL_BASED_OUTPATIENT_CLINIC_OR_DEPARTMENT_OTHER): Payer: Self-pay | Admitting: *Deleted

## 2022-03-07 ENCOUNTER — Other Ambulatory Visit: Payer: Self-pay

## 2022-03-07 ENCOUNTER — Emergency Department (HOSPITAL_BASED_OUTPATIENT_CLINIC_OR_DEPARTMENT_OTHER): Payer: Medicaid Other | Admitting: Radiology

## 2022-03-07 DIAGNOSIS — J069 Acute upper respiratory infection, unspecified: Secondary | ICD-10-CM | POA: Diagnosis not present

## 2022-03-07 DIAGNOSIS — M79661 Pain in right lower leg: Secondary | ICD-10-CM | POA: Diagnosis not present

## 2022-03-07 DIAGNOSIS — E119 Type 2 diabetes mellitus without complications: Secondary | ICD-10-CM | POA: Diagnosis not present

## 2022-03-07 DIAGNOSIS — R059 Cough, unspecified: Secondary | ICD-10-CM | POA: Diagnosis present

## 2022-03-07 DIAGNOSIS — Z7984 Long term (current) use of oral hypoglycemic drugs: Secondary | ICD-10-CM | POA: Diagnosis not present

## 2022-03-07 DIAGNOSIS — I10 Essential (primary) hypertension: Secondary | ICD-10-CM | POA: Diagnosis not present

## 2022-03-07 DIAGNOSIS — Z20822 Contact with and (suspected) exposure to covid-19: Secondary | ICD-10-CM | POA: Diagnosis not present

## 2022-03-07 NOTE — ED Triage Notes (Signed)
Nasal congestion, cough, body aches x 3 days.  ? ?Right knee pain and right foot swelling for 5 days. Denies injury ?

## 2022-03-08 ENCOUNTER — Emergency Department (HOSPITAL_BASED_OUTPATIENT_CLINIC_OR_DEPARTMENT_OTHER)
Admission: EM | Admit: 2022-03-08 | Discharge: 2022-03-08 | Disposition: A | Discharge status: Home / Self Care | Payer: Medicaid Other | Attending: Emergency Medicine | Admitting: Emergency Medicine

## 2022-03-08 DIAGNOSIS — M79661 Pain in right lower leg: Secondary | ICD-10-CM

## 2022-03-08 DIAGNOSIS — J069 Acute upper respiratory infection, unspecified: Secondary | ICD-10-CM

## 2022-03-08 LAB — RESP PANEL BY RT-PCR (FLU A&B, COVID) ARPGX2
Influenza A by PCR: NEGATIVE
Influenza B by PCR: NEGATIVE
SARS Coronavirus 2 by RT PCR: NEGATIVE

## 2022-03-08 LAB — GROUP A STREP BY PCR: Group A Strep by PCR: NOT DETECTED

## 2022-03-08 MED ORDER — IPRATROPIUM-ALBUTEROL 0.5-2.5 (3) MG/3ML IN SOLN
3.0000 mL | Freq: Once | RESPIRATORY_TRACT | Status: AC
  Administered 2022-03-08: 3 mL via RESPIRATORY_TRACT
  Filled 2022-03-08: qty 3

## 2022-03-08 MED ORDER — DEXAMETHASONE 4 MG PO TABS
10.0000 mg | ORAL_TABLET | Freq: Once | ORAL | Status: AC
  Administered 2022-03-08: 10 mg via ORAL
  Filled 2022-03-08: qty 3

## 2022-03-08 MED ORDER — ALBUTEROL SULFATE HFA 108 (90 BASE) MCG/ACT IN AERS
2.0000 | INHALATION_SPRAY | RESPIRATORY_TRACT | Status: DC | PRN
Start: 2022-03-08 — End: 2022-03-08
  Filled 2022-03-08: qty 6.7

## 2022-03-08 MED ORDER — ACETAMINOPHEN 325 MG PO TABS
650.0000 mg | ORAL_TABLET | Freq: Once | ORAL | Status: AC
  Administered 2022-03-08: 650 mg via ORAL
  Filled 2022-03-08: qty 2

## 2022-03-08 MED ORDER — RIVAROXABAN 15 MG PO TABS
15.0000 mg | ORAL_TABLET | Freq: Once | ORAL | Status: AC
  Administered 2022-03-08: 15 mg via ORAL
  Filled 2022-03-08: qty 1

## 2022-03-08 NOTE — ED Provider Notes (Signed)
?[facility] EMERGENCY DEPT ?Provider Note ? ? ?CSN: [id number] ?Arrival date & time: 03/07/22  2316 ? ?  ? ?History ? ?Chief Complaint  ?Patient presents with  ? Generalized Body Aches  ? ? ?Dennis Mcdaniel is a 71 y.o. male. ? ?The history is provided by the patient. A language interpreter was used.  ?He has history of hypertension, diabetes and comes in with cough and fever for the last 2 days.  Fever has been low-grade, as high as 101.  He denies any chills or sweats.  He is complaining of a sore throat.  Cough is nonproductive, but he has noted some shortness of breath.  He denies any vomiting or diarrhea.  He is also complaining of pain in his right knee, calf, foot and is noted that his right foot has swollen to where he can get his shoes on.  He denies any trauma.  He has been taking ibuprofen for his fever with good relief.  There have been multiple sick contacts at home with similar illness.  He is a non-smoker. ?  ?Home Medications ?Prior to Admission medications   ?Medication Sig Start Date End Date Taking? Authorizing Provider  ?metFORMIN (GLUCOPHAGE) 500 MG tablet Take 500 mg by mouth 2 (two) times daily. 02/12/18   [provider]  ?oxyCODONE-acetaminophen (PERCOCET) 5-325 MG tablet Take 1 tablet by mouth every 4 (four) hours as needed. 04/15/19   Garlon Hatchet, PA-C  ?   ? ?Allergies    ?Pork allergy and Ibuprofen   ? ?Review of Systems   ?Review of Systems  ?All other systems reviewed and are negative. ? ?Physical Exam ?Updated Vital Signs ?BP (!) 144/88 (BP Location: Left Arm)   Pulse 85   Temp 98.3 ?F (36.8 ?C)   Resp 18   SpO2 96%  ?Physical Exam ?Vitals and nursing note reviewed.  ?71 year old male, resting comfortably and in no acute distress. Vital signs are significant for mildly elevated blood pressure. Oxygen saturation is 96%, which is normal. ?Head is normocephalic and atraumatic. PERRLA, EOMI. Oropharynx is clear.  Tympanic membranes are clear. ?Neck is  nontender and supple without adenopathy or JVD. ?Back is nontender and there is no CVA tenderness. ?Lungs have a slightly prolonged exhalation phase and mild expiratory wheezes are noted.  There are no rales or rhonchi. ?Chest is nontender. ?Heart has regular rate and rhythm without murmur. ?Abdomen is soft, flat, nontender without masses or hepatosplenomegaly and peristalsis is normoactive. ?Extremities: There is trace pretibial and pedal edema bilaterally, and it is symmetric.  Calf circumference is symmetric.  There is no knee effusion or erythema.  There is tenderness to palpation over the right calf without any cord palpable.  Denna Haggard' sign is positive.  There is no tenderness palpation in the right foot or ankle and full passive range of motion is present without pain. ?Skin is warm and dry without rash. ?Neurologic: Mental status is normal, cranial nerves are intact, there are no motor or sensory deficits. ? ?ED Results / Procedures / Treatments   ?Labs ?(all labs ordered are listed, but only abnormal results are displayed) ?Labs Reviewed  ?RESP PANEL BY RT-PCR (FLU A&B, COVID) ARPGX2  ?GROUP A STREP BY PCR  ? ? ?EKG ?None ? ?Radiology ?DG Knee Complete 4 Views Right ? ?Result Date: 03/08/2022 ?CLINICAL DATA:  Right knee pain for 5 days EXAM: RIGHT KNEE - COMPLETE 4+ VIEW COMPARISON:  None. FINDINGS: Mild degenerative changes in the right knee with medial compartment  narrowing and small medial compartment osteophyte formation. No evidence of acute fracture or dislocation. No focal bone lesion or bone destruction. Small right knee effusion. IMPRESSION: Mild medial compartment degenerative changes with small effusion. No acute bony abnormalities. Electronically Signed   By: Burman Nieves M.D.   On: 03/08/2022 00:00   ? ?Procedures ?Procedures  ? ? ?Medications Ordered in ED ?Medications  ?albuterol (VENTOLIN HFA) 108 (90 Base) MCG/ACT inhaler 2 puff (has no administration in time range)  ?ipratropium-albuterol  (DUONEB) 0.5-2.5 (3) MG/3ML nebulizer solution 3 mL (3 mLs Nebulization Given 03/08/22 0316)  ?dexamethasone (DECADRON) tablet 10 mg (10 mg Oral Given 03/08/22 0314)  ?Rivaroxaban (XARELTO) tablet 15 mg (15 mg Oral Given 03/08/22 0314)  ?acetaminophen (TYLENOL) tablet 650 mg (650 mg Oral Given 03/08/22 0331)  ? ? ?ED Course/ Medical Decision Making/ A&P ?  ?                        ?Medical Decision Making ?Amount and/or Complexity of Data Reviewed ?Radiology: ordered. ? ?Risk ?OTC drugs. ?Prescription drug management. ? ? ?Respiratory tract infection, probably viral, given multiple sick contacts at home.  He is only when he is complaining of a sore throat, will check strep PCR.  Respiratory pathogen panel was obtained and is negative for COVID-19 and influenza.  He does show some evidence of bronchospasm, will be given a dose of dexamethasone and nebulizer treatment with albuterol and ipratropium.  Calf soft and foot swelling appears to be symmetric, but he does have unilateral calf tenderness and will need to be tested for DVT.  He is given an initial dose of rivaroxaban and will need to be brought back in the morning for venous ultrasound.  Old records are reviewed, and he has no relevant past visits. ? ?Following nebulizer treatment, lungs are clear and he is no longer coughing.  He is discharged with an albuterol inhaler and will be brought back for venous ultrasound in the morning. ? ? ? ? ? ? ? ?Final Clinical Impression(s) / ED Diagnoses ?Final diagnoses:  ?Viral URI with cough  ?Right calf pain  ? ? ?Rx / DC Orders ?ED Discharge Orders   ? ?      Ordered  ?  US Venous Img Lower Unilateral Right       ? 03/08/22 0303  ? ?  ?  ? ?  ? ? ?  ?Dione Booze, MD ?03/08/22 0423 ? ?

## 2022-03-20 ENCOUNTER — Other Ambulatory Visit: Payer: Self-pay

## 2022-03-20 ENCOUNTER — Encounter (HOSPITAL_BASED_OUTPATIENT_CLINIC_OR_DEPARTMENT_OTHER): Payer: Self-pay | Admitting: Emergency Medicine

## 2022-03-20 ENCOUNTER — Emergency Department (HOSPITAL_BASED_OUTPATIENT_CLINIC_OR_DEPARTMENT_OTHER)
Admission: EM | Admit: 2022-03-20 | Discharge: 2022-03-20 | Disposition: A | Discharge status: Home / Self Care | Payer: Medicaid Other | Attending: Emergency Medicine | Admitting: Emergency Medicine

## 2022-03-20 ENCOUNTER — Emergency Department (HOSPITAL_BASED_OUTPATIENT_CLINIC_OR_DEPARTMENT_OTHER): Payer: Medicaid Other

## 2022-03-20 DIAGNOSIS — M25561 Pain in right knee: Secondary | ICD-10-CM

## 2022-03-20 DIAGNOSIS — Z79899 Other long term (current) drug therapy: Secondary | ICD-10-CM | POA: Insufficient documentation

## 2022-03-20 DIAGNOSIS — M7121 Synovial cyst of popliteal space [Baker], right knee: Secondary | ICD-10-CM | POA: Diagnosis not present

## 2022-03-20 DIAGNOSIS — Z8616 Personal history of COVID-19: Secondary | ICD-10-CM | POA: Diagnosis not present

## 2022-03-20 DIAGNOSIS — Z7984 Long term (current) use of oral hypoglycemic drugs: Secondary | ICD-10-CM | POA: Diagnosis not present

## 2022-03-20 DIAGNOSIS — E119 Type 2 diabetes mellitus without complications: Secondary | ICD-10-CM | POA: Diagnosis not present

## 2022-03-20 DIAGNOSIS — I10 Essential (primary) hypertension: Secondary | ICD-10-CM | POA: Insufficient documentation

## 2022-03-20 MED ORDER — CEPHALEXIN 500 MG PO CAPS: 500.0000 mg | ORAL_CAPSULE | Freq: Two times a day (BID) | ORAL | 0 refills | Status: AC

## 2022-03-20 MED ORDER — CEPHALEXIN 250 MG PO CAPS
500.0000 mg | ORAL_CAPSULE | Freq: Once | ORAL | Status: AC
  Administered 2022-03-20: 500 mg via ORAL
  Filled 2022-03-20: qty 2

## 2022-03-20 MED ORDER — ACETAMINOPHEN-CODEINE #3 300-30 MG PO TABS: 1.0000 | ORAL_TABLET | Freq: Four times a day (QID) | ORAL | 0 refills | Status: DC | PRN

## 2022-03-20 NOTE — Discharge Instructions (Addendum)
It was a pleasure caring for you today in the emergency department. ° °Please return to the emergency department for any worsening or worrisome symptoms. ° ° °

## 2022-03-20 NOTE — ED Triage Notes (Addendum)
Pt here for rt foot pain , states had infection now more swelling from groin to foot no fever  was to come back after last visist for [place] but did not do this ?

## 2022-03-20 NOTE — ED Provider Notes (Signed)
?[facility] EM[facility] DEPT ?Provider Note ? ? ?CSN: [id number] ?Arrival date & time: 03/20/22  1517 ? ?  ? ?History ? ?Chief Complaint  ?Patient presents with  ? Foot Pain  ? ? ?Dennis Mcdaniel is a 71 y.o. male. ? ? ?This is a 71 y.o. male with significant medical history as below, including pretension, DM who presents to the ED with complaint of Right leg/foot pain x1 month, worse in the knee area. ? ?Location: Right knee ?Duration: 1 month ?Onset: Gradual ?Timing: Constant ?Description: Aching, throbbing sensation to knee ?Severity: Mild ?Exacerbating/Alleviating Factors: Worse with ambulation, weightbearing ?Associated Symptoms: Mild right leg swelling.  Warmth to foot ?Pertinent Negatives: No fevers, chills, nausea or vomiting, no dyspnea, change in bowel or bladder function, no IV drug use, no trauma, no back pain ? ? ? ? ? ?Past Medical History: ?No date: BMI 31.0-31.9,adult ?No date: COVID-19 ?No date: Diabetes mellitus without complication ([facility]) ?No date: Hypertension ? ?History reviewed. No pertinent surgical history.  ? ? ?The history is provided by the patient. The history is limited by a language barrier. A language interpreter was used.  ?Foot Pain ?Pertinent negatives include no chest pain, no abdominal pain, no headaches and no shortness of breath.  ? ?  ? ?Home Medications ?Prior to Admission medications   ?Medication Sig Start Date End Date Taking? Authorizing Provider  ?acetaminophen-codeine (TYLENOL #3) 300-30 MG tablet Take 1 tablet by mouth every 6 (six) hours as needed for moderate pain. 03/20/22  Yes Tanda Rockers A, DO  ?cephALEXin (KEFLEX) 500 MG capsule Take 1 capsule (500 mg total) by mouth 2 (two) times daily for 7 days. 03/20/22 03/27/22 Yes Tanda Rockers A, DO  ?metFORMIN (GLUCOPHAGE) 500 MG tablet Take 500 mg by mouth 2 (two) times daily. 02/12/18   [provider]  ?oxyCODONE-acetaminophen (PERCOCET) 5-325 MG tablet Take 1 tablet by mouth every 4 (four) hours as  needed. 04/15/19   Garlon Hatchet, PA-C  ?   ? ?Allergies    ?Pork allergy and Ibuprofen   ? ?Review of Systems   ?Review of Systems  ?Constitutional:  Negative for chills and fever.  ?HENT:  Negative for facial swelling and trouble swallowing.   ?Eyes:  Negative for photophobia and visual disturbance.  ?Respiratory:  Negative for cough and shortness of breath.   ?Cardiovascular:  Negative for chest pain and palpitations.  ?Gastrointestinal:  Negative for abdominal pain, nausea and vomiting.  ?Endocrine: Negative for polydipsia and polyuria.  ?Genitourinary:  Negative for difficulty urinating and hematuria.  ?Musculoskeletal:  Positive for arthralgias. Negative for gait problem and joint swelling.  ?Skin:  Positive for wound. Negative for pallor and rash.  ?Neurological:  Negative for syncope and headaches.  ?Psychiatric/Behavioral:  Negative for agitation and confusion.   ? ?Physical Exam ?Updated Vital Signs ?BP (!) 150/82 (BP Location: Right Arm)   Pulse 84   Temp 98.3 ?F (36.8 ?C) (Oral)   Resp 16   SpO2 98%  ?Physical Exam ?Vitals and nursing note reviewed.  ?Constitutional:   ?   General: He is not in acute distress. ?   Appearance: Normal appearance. He is well-developed. He is not toxic-appearing or diaphoretic.  ?HENT:  ?   Head: Normocephalic and atraumatic.  ?   Right Ear: External ear normal.  ?   Left Ear: External ear normal.  ?   Mouth/Throat:  ?   Mouth: Mucous membranes are moist.  ?Eyes:  ?   General: No scleral icterus. ?Cardiovascular:  ?  Rate and Rhythm: Normal rate and regular rhythm.  ?   Pulses: Normal pulses.  ?   Heart sounds: Normal heart sounds.  ?Pulmonary:  ?   Effort: Pulmonary effort is normal. No respiratory distress.  ?   Breath sounds: Normal breath sounds.  ?Abdominal:  ?   General: Abdomen is flat.  ?   Palpations: Abdomen is soft.  ?   Tenderness: There is no abdominal tenderness.  ?Musculoskeletal:     ?   General: Normal range of motion.  ?   Cervical back: Normal range  of motion.  ?   Right lower leg: No edema.  ?   Left lower leg: No edema.  ?     Legs: ? ?   Comments: No pain to knee with provocative testing ?Negative Lachman and posterior drawer ?Negative varus and valgus of the knee ?Lower extremities neurovascular intact  ?Skin: ?   General: Skin is warm and dry.  ?   Capillary Refill: Capillary refill takes less than 2 seconds.  ?Neurological:  ?   Mental Status: He is alert and oriented to person, place, and time.  ?Psychiatric:     ?   Mood and Affect: Mood normal.     ?   Behavior: Behavior normal.  ? ? ?ED Results / Procedures / Treatments   ?Labs ?(all labs ordered are listed, but only abnormal results are displayed) ?Labs Reviewed - No data to display ? ?EKG ?None ? ?Radiology ?US Venous I[place]oreamg Lower Unilateral Right ? ?Result Date: 03/20/2022 ?CLINICAL DATA:  Right leg pain EXAM: RIGHT LOWER EXTREMITY VENOUS DOPPLER ULTRASOUND TECHNIQUE: Cephas Revard-scale sonography with graded compression, as well as color Doppler and duplex ultrasound were performed to evaluate the lower extremity deep venous systems from the level of the common femoral vein and including the common femoral, femoral, profunda femoral, popliteal and calf veins including the posterior tibial, peroneal and gastrocnemius veins when visible. The superficial great saphenous vein was also interrogated. Spectral Doppler was utilized to evaluate flow at rest and with distal augmentation maneuvers in the common femoral, femoral and popliteal veins. COMPARISON:  None. FINDINGS: Contralateral Common Femoral Vein: Respiratory phasicity is normal and symmetric with the symptomatic side. No evidence of thrombus. Normal compressibility. Common Femoral Vein: No evidence of thrombus. Normal compressibility, respiratory phasicity and response to augmentation. Saphenofemoral Junction: No evidence of thrombus. Normal compressibility and flow on color Doppler imaging. Profunda Femoral Vein: No evidence of thrombus. Normal  compressibility and flow on color Doppler imaging. Femoral Vein: No evidence of thrombus. Normal compressibility, respiratory phasicity and response to augmentation. Popliteal Vein: No evidence of thrombus. Normal compressibility, respiratory phasicity and response to augmentation. Calf Veins: No evidence of thrombus. Normal compressibility and flow on color Doppler imaging. Superficial Great Saphenous Vein: No evidence of thrombus. Normal compressibility. Other Findings:  Small right knee effusion incidentally noted. IMPRESSION: Negative for right lower extremity acute DVT Small right knee joint effusion noted. Electronically Signed   By: Judie PetitM.  Shick M.D.   On: 03/20/2022 16:57   ? ?Procedures ?Procedures  ? ? ?Medications Ordered in ED ?Medications  ?cephALEXin (KEFLEX) capsule 500 mg (500 mg Oral Given 03/20/22 1845)  ? ? ?ED Course/ Medical Decision Making/ A&P ?  ?                        ?Medical Decision Making ?Risk ?Prescription drug management. ? ? ?Initial Impression and Ddx ?This patient presents to the Emergency Department for  the above complaint. This involves an extensive number of treatment options and is a complaint that carries with it a high risk of complications and morbidity. Vital signs were reviewed.  ? ?Patient was seen for similar complaint 3/15, was advised to return to the ER for duplex but did not return. ? ?Serious etiologies considered. Ddx includes but is not limited to: DVT, cellulitis, MSK ? ?Patient PMH that increases complexity of ED encounter: Arabic speaking ? ?Social determinants of health include Arabic speaking ? ? ?Previous records obtained and reviewed  ? ?Interpretation of Diagnostics ?Labs & imaging results that were available during my care of the patient were visualized by me and considered in my medical decision making. ?  ?I ordered imaging studies which included lower extremity duplex and I visualized the imaging and I agree with radiologist interpretation.  No  DVT ? ?Cardiac monitoring reviewed and interpreted personally which shows NSR ? ?PDMP reviewed  ? ? ?Patient Reassessment and Ultimate Disposition/Management ? ? ?  ? ?Lower extremity duplex was negative.  I did recommend x-r

## 2022-03-20 NOTE — ED Notes (Signed)
Right foot cleansed with wound cleanser, antibiotic ointment placed and wrap applied  ?

## 2022-08-03 ENCOUNTER — Encounter (HOSPITAL_BASED_OUTPATIENT_CLINIC_OR_DEPARTMENT_OTHER): Payer: Self-pay | Admitting: Emergency Medicine

## 2022-08-03 ENCOUNTER — Emergency Department (HOSPITAL_BASED_OUTPATIENT_CLINIC_OR_DEPARTMENT_OTHER)
Admission: EM | Admit: 2022-08-03 | Discharge: 2022-08-03 | Disposition: A | Discharge status: Home / Self Care | Payer: Medicaid Other | Attending: Emergency Medicine | Admitting: Emergency Medicine

## 2022-08-03 ENCOUNTER — Other Ambulatory Visit: Payer: Self-pay

## 2022-08-03 DIAGNOSIS — T25222A Burn of second degree of left foot, initial encounter: Secondary | ICD-10-CM | POA: Insufficient documentation

## 2022-08-03 DIAGNOSIS — X100XXA Contact with hot drinks, initial encounter: Secondary | ICD-10-CM | POA: Insufficient documentation

## 2022-08-03 DIAGNOSIS — Z23 Encounter for immunization: Secondary | ICD-10-CM | POA: Insufficient documentation

## 2022-08-03 DIAGNOSIS — T300 Burn of unspecified body region, unspecified degree: Secondary | ICD-10-CM

## 2022-08-03 MED ORDER — CEPHALEXIN 500 MG PO CAPS: 500.0000 mg | ORAL_CAPSULE | Freq: Two times a day (BID) | ORAL | 0 refills | Status: AC

## 2022-08-03 MED ORDER — TETANUS-DIPHTH-ACELL PERTUSSIS 5-2.5-18.5 LF-MCG/0.5 IM SUSY
0.5000 mL | PREFILLED_SYRINGE | Freq: Once | INTRAMUSCULAR | Status: AC
  Administered 2022-08-03: 0.5 mL via INTRAMUSCULAR
  Filled 2022-08-03: qty 0.5

## 2022-08-03 MED ORDER — OXYCODONE HCL 5 MG PO TABS: 5.0000 mg | ORAL_TABLET | Freq: Four times a day (QID) | ORAL | 0 refills | Status: DC | PRN

## 2022-08-03 MED ORDER — BACITRACIN ZINC 500 UNIT/GM EX OINT: TOPICAL_OINTMENT | Freq: Two times a day (BID) | CUTANEOUS | Status: DC

## 2022-08-03 MED ORDER — BACITRACIN ZINC 500 UNIT/GM EX OINT: 1.0000 | TOPICAL_OINTMENT | Freq: Two times a day (BID) | CUTANEOUS | 0 refills | Status: DC

## 2022-08-03 NOTE — ED Triage Notes (Signed)
Patient spilled boiling tea on his left foot this evening.  Patient has blistering and skin sloughing to left great toe and second toe.  Patient reports a history of diabetes and neuropathy.

## 2022-08-03 NOTE — ED Notes (Signed)
Arabic interpretor Dalia # [id number]

## 2022-08-03 NOTE — Discharge Instructions (Signed)
Recommend Tylenol and ibuprofen for pain.  Oxycodone is a narcotic pain medicine so do not use with alcohol or drugs or dangerous activities including driving.  Use antibiotic ointment twice a day.  Take antibiotic orally as prescribed.  Follow-up with your primary care doctor.

## 2022-08-03 NOTE — ED Provider Notes (Signed)
[facility] EMERGENCY DEPT Provider Note   CSN: [id number] Arrival date & time: 08/03/22  2102     History  Chief Complaint  Patient presents with   Burn    Dennis Mcdaniel is a 71 y.o. male.  Patient burned his left foot on hot tea prior to arrival.  Does not think tetanus shot is up-to-date.  Denies any nausea, vomiting.  Burn is hurting.  Nothing makes it worse or better.  Denies any weakness, numbness, chills.  History of diabetes.  The history is provided by the patient.       Home Medications Prior to Admission medications   Medication Sig Start Date End Date Taking? Authorizing Provider  bacitracin ointment Apply 1 Application topically 2 (two) times daily. 08/03/22  Yes Micalah Cabezas, DO  cephALEXin (KEFLEX) 500 MG capsule Take 1 capsule (500 mg total) by mouth 2 (two) times daily for 5 days. 08/03/22 08/08/22 Yes Kathlyne Loud, DO  oxyCODONE (ROXICODONE) 5 MG immediate release tablet Take 1 tablet (5 mg total) by mouth every 6 (six) hours as needed for up to 10 doses for breakthrough pain. 08/03/22  Yes Jacquelynn Friend, DO  acetaminophen-codeine (TYLENOL #3) 300-30 MG tablet Take 1 tablet by mouth every 6 (six) hours as needed for moderate pain. 03/20/22   Sloan Leiter, DO  metFORMIN (GLUCOPHAGE) 500 MG tablet Take 500 mg by mouth 2 (two) times daily. 02/12/18   [provider]  oxyCODONE-acetaminophen (PERCOCET) 5-325 MG tablet Take 1 tablet by mouth every 4 (four) hours as needed. 04/15/19   Garlon Hatchet, PA-C      Allergies    Pork allergy and Ibuprofen    Review of Systems   Review of Systems  Physical Exam Updated Vital Signs BP (!) 148/81 (BP Location: Right Arm)   Pulse 75   Temp 97.6 F (36.4 C) (Temporal)   Resp 16   Ht 5\' 6"  (1.676 m)   Wt 85.7 kg   SpO2 98%   BMI 30.51 kg/m  Physical Exam Vitals and nursing note reviewed.  Constitutional:      General: He is not in acute distress.    Appearance: He is  well-developed.  Cardiovascular:     Rate and Rhythm: Normal rate and regular rhythm.     Pulses: Normal pulses.     Heart sounds: No murmur heard. Pulmonary:     Effort: Pulmonary effort is normal. No respiratory distress.     Breath sounds: Normal breath sounds.  Abdominal:     Palpations: Abdomen is soft.  Musculoskeletal:        General: No swelling.     Cervical back: Neck supple.  Skin:    General: Skin is warm and dry.     Capillary Refill: Capillary refill takes less than 2 seconds.     Comments: Blistering of the left big toe, left second toe, there is no circumferential burn  Neurological:     General: No focal deficit present.     Mental Status: He is alert.     Motor: No weakness.  Psychiatric:        Mood and Affect: Mood normal.     ED Results / Procedures / Treatments   Labs (all labs ordered are listed, but only abnormal results are displayed) Labs Reviewed - No data to display  EKG None  Radiology No results found.  Procedures Procedures    Medications Ordered in ED Medications  bacitracin ointment ( Topical Given 08/03/22 2121)  Tdap (BOOSTRIX) injection 0.5 mL (0.5 mLs Intramuscular Given 08/03/22 2134)    ED Course/ Medical Decision Making/ A&P                           Medical Decision Making Risk OTC drugs. Prescription drug management.   [name] is here with burn on his left foot.  Normal vitals.  No fever.  Tetanus shot to be updated.  Has second-degree burn to the top of his left foot.  No circumferential burn.  Neurovascular neuromuscular intact.  Placed in a postop shoe.  Bacitracin ointment placed.  Will prescribe Keflex given that he is diabetic.  Roxicodone written for breakthrough pain.  Recommend Tylenol and ibuprofen.  Wound care instructions given.  Discharged in good condition  This chart was dictated using voice recognition software.  Despite best efforts to proofread,  errors can occur which can change the  documentation meaning.         Final Clinical Impression(s) / ED Diagnoses Final diagnoses:  Burn    Rx / DC Orders ED Discharge Orders          Ordered    oxyCODONE (ROXICODONE) 5 MG immediate release tablet  Every 6 hours PRN        08/03/22 2136    cephALEXin (KEFLEX) 500 MG capsule  2 times daily        08/03/22 2136    bacitracin ointment  2 times daily        08/03/22 2136              Virgina Norfolk, DO 08/03/22 2137

## 2022-08-25 ENCOUNTER — Encounter (HOSPITAL_BASED_OUTPATIENT_CLINIC_OR_DEPARTMENT_OTHER): Payer: Self-pay

## 2022-08-25 ENCOUNTER — Emergency Department (HOSPITAL_BASED_OUTPATIENT_CLINIC_OR_DEPARTMENT_OTHER): Payer: Medicaid Other

## 2022-08-25 ENCOUNTER — Other Ambulatory Visit: Payer: Self-pay

## 2022-08-25 ENCOUNTER — Emergency Department (HOSPITAL_BASED_OUTPATIENT_CLINIC_OR_DEPARTMENT_OTHER)
Admission: EM | Admit: 2022-08-25 | Discharge: 2022-08-26 | Disposition: A | Discharge status: Home / Self Care | Payer: Medicaid Other | Attending: Emergency Medicine | Admitting: Emergency Medicine

## 2022-08-25 DIAGNOSIS — I10 Essential (primary) hypertension: Secondary | ICD-10-CM | POA: Diagnosis not present

## 2022-08-25 DIAGNOSIS — E119 Type 2 diabetes mellitus without complications: Secondary | ICD-10-CM | POA: Insufficient documentation

## 2022-08-25 DIAGNOSIS — Z7984 Long term (current) use of oral hypoglycemic drugs: Secondary | ICD-10-CM | POA: Diagnosis not present

## 2022-08-25 DIAGNOSIS — X58XXXA Exposure to other specified factors, initial encounter: Secondary | ICD-10-CM | POA: Diagnosis not present

## 2022-08-25 DIAGNOSIS — S91302D Unspecified open wound, left foot, subsequent encounter: Secondary | ICD-10-CM

## 2022-08-25 DIAGNOSIS — S91302A Unspecified open wound, left foot, initial encounter: Secondary | ICD-10-CM | POA: Diagnosis not present

## 2022-08-25 DIAGNOSIS — S99922A Unspecified injury of left foot, initial encounter: Secondary | ICD-10-CM | POA: Diagnosis present

## 2022-08-25 DIAGNOSIS — Z8616 Personal history of COVID-19: Secondary | ICD-10-CM | POA: Diagnosis not present

## 2022-08-25 NOTE — ED Triage Notes (Signed)
Patient here POV from Home.  Endorses Spilling Hot Tea on his Left Large Tea approximately 20 Days ago and being seen and treated in ED for Same. Returns for Re-Evaluation due to Swelling to Foot.   NAD Noted during Triage. A&Ox4. GCS 15. Ambulatory.

## 2022-08-26 ENCOUNTER — Emergency Department (HOSPITAL_BASED_OUTPATIENT_CLINIC_OR_DEPARTMENT_OTHER): Payer: Medicaid Other

## 2022-08-26 MED ORDER — LIDOCAINE 5 % EX OINT: 1.0000 | TOPICAL_OINTMENT | Freq: Three times a day (TID) | CUTANEOUS | 0 refills | Status: DC | PRN

## 2022-08-26 MED ORDER — LIDOCAINE 5 % EX OINT: TOPICAL_OINTMENT | Freq: Once | CUTANEOUS | Status: DC

## 2022-08-26 MED ORDER — CEPHALEXIN 500 MG PO CAPS: 500.0000 mg | ORAL_CAPSULE | Freq: Three times a day (TID) | ORAL | 0 refills | Status: AC

## 2022-08-26 MED ORDER — LIDOCAINE 4 % EX CREA
TOPICAL_CREAM | Freq: Once | CUTANEOUS | Status: AC
  Administered 2022-08-26: 1 via TOPICAL
  Filled 2022-08-26: qty 5

## 2022-08-26 MED ORDER — ACETAMINOPHEN 500 MG PO TABS
1000.0000 mg | ORAL_TABLET | Freq: Once | ORAL | Status: AC
  Administered 2022-08-26: 1000 mg via ORAL
  Filled 2022-08-26: qty 2

## 2022-08-26 MED ORDER — CEPHALEXIN 250 MG PO CAPS
500.0000 mg | ORAL_CAPSULE | Freq: Once | ORAL | Status: AC
  Administered 2022-08-26: 500 mg via ORAL
  Filled 2022-08-26: qty 2

## 2022-08-26 NOTE — ED Notes (Signed)
Portable L foot xray in progress

## 2022-08-26 NOTE — Discharge Instructions (Addendum)
For pain control you may take at 1000 mg of Tylenol every 8 hours as needed. Keep using the silvadene and bacitracin creams.  ??????? ??? ?????? ????? ????? 1000 ???? ?? ???????? ?? 8 ????? ??? ??????. ????? ?? ??????? ?????? ???????? ???????????.

## 2022-08-26 NOTE — ED Provider Notes (Signed)
[facility] EMERGENCY DEPT Provider Note  CSN: [id number] Arrival date & time: 08/25/22 2153  Chief Complaint(s) Foot Swelling  HPI Dennis Mcdaniel is a 71 y.o. male with a past medical history listed below including diabetes and hypertension who sustained burn to the dorsum of the left foot on August 10 presents for unhealed wound.  He reports that he has been using the Silvadene but wound is still not healed.  He is endorsing severe pain.  He has not taken any pain medicine at home that he was prescribed oxycodone.  He states that he lost the oxycodone and does not know where it went.  Patient also reports that the strap to his postop shoe broke.  He denies any redness or swelling.  No other physical complaints.  The history is provided by the patient.    Past Medical History Past Medical History:  Diagnosis Date   BMI 31.0-31.9,adult    COVID-19    Diabetes mellitus without complication (HCC)    Hypertension    There are no problems to display for this patient.  Home Medication(s) Prior to Admission medications   Medication Sig Start Date End Date Taking? Authorizing Provider  cephALEXin (KEFLEX) 500 MG capsule Take 1 capsule (500 mg total) by mouth 3 (three) times daily for 7 days. 08/26/22 09/02/22 Yes Jameah Rouser, Amadeo Garnet, MD  lidocaine (XYLOCAINE) 5 % ointment Apply 1 Application topically 3 (three) times daily as needed for moderate pain. 08/26/22  Yes Jontavia Leatherbury, Amadeo Garnet, MD  acetaminophen-codeine (TYLENOL #3) 300-30 MG tablet Take 1 tablet by mouth every 6 (six) hours as needed for moderate pain. 03/20/22   Sloan Leiter, DO  bacitracin ointment Apply 1 Application topically 2 (two) times daily. 08/03/22   Curatolo, Adam, DO  metFORMIN (GLUCOPHAGE) 500 MG tablet Take 500 mg by mouth 2 (two) times daily. 02/12/18   [provider]  oxyCODONE (ROXICODONE) 5 MG immediate release tablet Take 1 tablet (5 mg total) by mouth every 6 (six) hours as needed for  up to 10 doses for breakthrough pain. 08/03/22   Curatolo, Adam, DO  oxyCODONE-acetaminophen (PERCOCET) 5-325 MG tablet Take 1 tablet by mouth every 4 (four) hours as needed. 04/15/19   Garlon Hatchet, PA-C                                                                                                                                    Allergies Pork allergy and Ibuprofen  Review of Systems Review of Systems As noted in HPI  Physical Exam Vital Signs  I have reviewed the triage vital signs BP 109/79   Pulse 71   Temp 97.9 F (36.6 C)   Resp 18   Ht 5\' 6"  (1.676 m)   Wt 85.7 kg   SpO2 97%   BMI 30.49 kg/m   Physical Exam Vitals reviewed.  Constitutional:      General: He is not in  acute distress.    Appearance: He is well-developed. He is not diaphoretic.  HENT:     Head: Normocephalic and atraumatic.     Right Ear: External ear normal.     Left Ear: External ear normal.     Nose: Nose normal.     Mouth/Throat:     Mouth: Mucous membranes are moist.  Eyes:     General: No scleral icterus.    Conjunctiva/sclera: Conjunctivae normal.  Neck:     Trachea: Phonation normal.  Cardiovascular:     Rate and Rhythm: Normal rate and regular rhythm.  Pulmonary:     Effort: Pulmonary effort is normal. No respiratory distress.     Breath sounds: No stridor.  Abdominal:     General: There is no distension.  Musculoskeletal:        General: Normal range of motion.     Cervical back: Normal range of motion.  Feet:     Left foot:     Skin integrity: Ulcer present. No erythema or warmth.     Comments: See image Neurological:     Mental Status: He is alert and oriented to person, place, and time.  Psychiatric:        Behavior: Behavior normal.      ED Results and Treatments Labs (all labs ordered are listed, but only abnormal results are displayed) Labs Reviewed - No data to display                                                                                                                        EKG  EKG Interpretation  Date/Time:    Ventricular Rate:    PR Interval:    QRS Duration:   QT Interval:    QTC Calculation:   R Axis:     Text Interpretation:         Radiology DG Foot Complete Left  Result Date: 08/26/2022 CLINICAL DATA:  Left Foot Swelling EXAM: LEFT FOOT - COMPLETE 3+ VIEW COMPARISON:  None Available. FINDINGS: No cortical erosion or destruction. There is no evidence of fracture or dislocation. There is no evidence of arthropathy or other focal bone abnormality. Plantar calcaneal spur. Dorsal midfoot subcutaneus soft tissue edema. No retained radiopaque foreign body. IMPRESSION: 1. No acute displaced fracture or dislocation. 2. No radiographic findings to suggest osteomyelitis. 3. Dorsal subcutaneus soft tissue edema. Electronically Signed   By: Tish Frederickson M.D.   On: 08/26/2022 00:49    Medications Ordered in ED Medications  acetaminophen (TYLENOL) tablet 1,000 mg (1,000 mg Oral Given 08/26/22 0133)  cephALEXin (KEFLEX) capsule 500 mg (500 mg Oral Given 08/26/22 0133)  lidocaine (LMX) 4 % cream (1 Application Topical Given 08/26/22 0159)  Procedures Procedures  (including critical care time)  Medical Decision Making / ED Course   Medical Decision Making Amount and/or Complexity of Data Reviewed Radiology: ordered and independent interpretation performed. Decision-making details documented in ED Course.  Risk OTC drugs. Prescription drug management.    Patient presents with pain to unhealed wound.  There is no notable erythema but patient is having tenderness to palpation extending beyond the wound itself. X-ray ordered and negative for any evidence concerning for necrotizing fasciitis or osteomyelitis. Patient provided with Tylenol and lidocaine cream.  We will also provide him with a prescription  for Keflex. Recommend he continue using Silvadene and bacitracin ointments Recommend PCP follow-up for continued wound management.       Final Clinical Impression(s) / ED Diagnoses Final diagnoses:  Open wound of left foot, subsequent encounter   The patient appears reasonably screened and/or stabilized for discharge and I doubt any other medical condition or other [facility] requiring further screening, evaluation, or treatment in the ED at this time. I have discussed the findings, Dx and Tx plan with the patient/family who expressed understanding and agree(s) with the plan. Discharge instructions discussed at length. The patient/family was given strict return precautions who verbalized understanding of the instructions. No further questions at time of discharge.  Disposition: Discharge  Condition: Good  ED Discharge Orders          Ordered    lidocaine (XYLOCAINE) 5 % ointment  3 times daily PRN        08/26/22 0204    cephALEXin (KEFLEX) 500 MG capsule  3 times daily        08/26/22 0204             Follow Up: Mindi Curling, PA-C [address] [place] [address] [phone]  Call  to schedule an appointment for close follow up           This chart was dictated using voice recognition software.  Despite best efforts to proofread,  errors can occur which can change the documentation meaning.    Fatima Blank, MD 08/26/22 [phone]

## 2023-03-02 ENCOUNTER — Telehealth (HOSPITAL_COMMUNITY): Payer: Self-pay | Admitting: *Deleted

## 2023-03-02 ENCOUNTER — Other Ambulatory Visit: Payer: Self-pay

## 2023-03-02 ENCOUNTER — Ambulatory Visit (HOSPITAL_COMMUNITY)
Admission: EM | Admit: 2023-03-02 | Discharge: 2023-03-02 | Disposition: A | Discharge status: Home / Self Care | Payer: Medicaid Other | Attending: Physician Assistant | Admitting: Physician Assistant

## 2023-03-02 ENCOUNTER — Encounter (HOSPITAL_COMMUNITY): Payer: Self-pay | Admitting: *Deleted

## 2023-03-02 DIAGNOSIS — E119 Type 2 diabetes mellitus without complications: Secondary | ICD-10-CM | POA: Diagnosis not present

## 2023-03-02 DIAGNOSIS — R0602 Shortness of breath: Secondary | ICD-10-CM | POA: Diagnosis present

## 2023-03-02 DIAGNOSIS — Z7984 Long term (current) use of oral hypoglycemic drugs: Secondary | ICD-10-CM | POA: Diagnosis not present

## 2023-03-02 DIAGNOSIS — Z8616 Personal history of COVID-19: Secondary | ICD-10-CM | POA: Diagnosis not present

## 2023-03-02 DIAGNOSIS — J069 Acute upper respiratory infection, unspecified: Secondary | ICD-10-CM | POA: Insufficient documentation

## 2023-03-02 DIAGNOSIS — R42 Dizziness and giddiness: Secondary | ICD-10-CM | POA: Diagnosis not present

## 2023-03-02 DIAGNOSIS — Z1152 Encounter for screening for COVID-19: Secondary | ICD-10-CM | POA: Insufficient documentation

## 2023-03-02 LAB — POCT URINALYSIS DIPSTICK, ED / UC
Bilirubin Urine: NEGATIVE
Glucose, UA: NEGATIVE mg/dL
Ketones, ur: NEGATIVE mg/dL
Leukocytes,Ua: NEGATIVE
Nitrite: NEGATIVE
Protein, ur: NEGATIVE mg/dL
Specific Gravity, Urine: 1.01 (ref 1.005–1.030)
Urobilinogen, UA: 0.2 mg/dL (ref 0.0–1.0)
pH: 5.5 (ref 5.0–8.0)

## 2023-03-02 LAB — POC INFLUENZA A AND B ANTIGEN (URGENT CARE ONLY)
INFLUENZA A ANTIGEN, POC: NEGATIVE
INFLUENZA B ANTIGEN, POC: NEGATIVE

## 2023-03-02 LAB — CBG MONITORING, ED: Glucose-Capillary: 154 mg/dL — ABNORMAL HIGH (ref 70–99)

## 2023-03-02 MED ORDER — BENZONATATE 100 MG PO CAPS: 100.0000 mg | ORAL_CAPSULE | Freq: Three times a day (TID) | ORAL | 0 refills | Status: DC

## 2023-03-02 MED ORDER — FLUTICASONE PROPIONATE 50 MCG/ACT NA SUSP: 1.0000 | Freq: Every day | NASAL | 0 refills | Status: DC

## 2023-03-02 MED ORDER — MECLIZINE HCL 25 MG PO TABS: 25.0000 mg | ORAL_TABLET | Freq: Three times a day (TID) | ORAL | 0 refills | Status: DC | PRN

## 2023-03-02 NOTE — ED Provider Notes (Addendum)
[facility]    CSN: [id number] Arrival date & time: 03/02/23  1850      History   Chief Complaint Chief Complaint  Patient presents with   Sore Throat   Otalgia   Shortness of Breath    HPI Dennis Mcdaniel is a 72 y.o. male.   Patient presents today with a 2-day history of URI symptoms.  Reports shortness of breath, cough, left otalgia, sore throat, dizziness which she describes as a room spinning sensation.  He denies any chest pain but did have an exposed sewed of shortness of breath that has since resolved.  Does report that he has been exposed to several household sick contacts who have been diagnosed with influenza B.  He has not had influenza vaccine recently.  He has had COVID several years ago.  He has had initial COVID vaccines but has not had most recent booster.  He does have a history of diabetes with last A1c 8.4% December 2023 in [place].  He is compliant with his medications.  He denies any recent head trauma or medication changes.  He denies any lightheadedness or syncope.  Reports that he had difficulty performing his daily activities at work because of the dizziness prompting evaluation today.     Past Medical History:  Diagnosis Date   BMI 31.0-31.9,adult    COVID-19    Diabetes mellitus without complication ([place])    Hypertension     There are no problems to display for this patient.   History reviewed. No pertinent surgical history.     Home Medications    Prior to Admission medications   Medication Sig Start Date End Date Taking? Authorizing Provider  benzonatate (TESSALON) 100 MG capsule Take 1 capsule (100 mg total) by mouth every 8 (eight) hours. 03/02/23  Yes Sumner Kirchman K, PA-C  fluticasone (FLONASE) 50 MCG/ACT nasal spray Place 1 spray into both nostrils daily. 03/02/23  Yes Zaelyn Noack, Derry Skill, PA-C  meclizine (ANTIVERT) 25 MG tablet Take 1 tablet (25 mg total) by mouth 3 (three) times daily as needed for dizziness. 03/02/23  Yes  Darlis Wragg K, PA-C  metFORMIN (GLUCOPHAGE) 500 MG tablet Take 500 mg by mouth 2 (two) times daily. 02/12/18  Yes [provider]    Family History History reviewed. No pertinent family history.  Social History Social History   Tobacco Use   Smoking status: Former   Smokeless tobacco: Never  Substance Use Topics   Alcohol use: Never   Drug use: Never     Allergies   Pork allergy and Ibuprofen   Review of Systems Review of Systems  Constitutional:  Positive for activity change. Negative for appetite change, fatigue and fever.  HENT:  Positive for congestion, ear pain and sore throat. Negative for sinus pressure and sneezing.   Respiratory:  Positive for cough. Negative for shortness of breath.   Cardiovascular:  Negative for chest pain.  Gastrointestinal:  Negative for abdominal pain, diarrhea, nausea and vomiting.  Neurological:  Positive for dizziness. Negative for seizures, syncope, weakness, light-headedness, numbness and headaches.     Physical Exam Triage Vital Signs ED Triage Vitals  Enc Vitals Group     BP 03/02/23 1916 (!) 151/88     Pulse Rate 03/02/23 1916 78     Resp 03/02/23 1916 18     Temp 03/02/23 1916 98.2 F (36.8 C)     Temp Source 03/02/23 1916 Oral     SpO2 03/02/23 1916 96 %  Weight --      Height --      Head Circumference --      Peak Flow --      Pain Score 03/02/23 1912 0     Pain Loc --      Pain Edu? --      Excl. in [place]? --    No data found.  Updated Vital Signs BP (!) 151/88 (BP Location: Left Arm)   Pulse 78   Temp 98.2 F (36.8 C) (Oral)   Resp 18   SpO2 96%   Visual Acuity Right Eye Distance:   Left Eye Distance:   Bilateral Distance:    Right Eye Near:   Left Eye Near:    Bilateral Near:     Physical Exam Vitals reviewed.  Constitutional:      General: He is awake.     Appearance: Normal appearance. He is well-developed. He is not ill-appearing.     Comments: Very pleasant male appears stated age  in no acute distress sitting comfortably in exam room  HENT:     Head: Normocephalic and atraumatic.     Right Ear: Tympanic membrane, ear canal and external ear normal. Tympanic membrane is not erythematous or bulging.     Left Ear: Tympanic membrane, ear canal and external ear normal. Tympanic membrane is not erythematous or bulging.     Nose: Nose normal.     Mouth/Throat:     Pharynx: Uvula midline. No oropharyngeal exudate or posterior oropharyngeal erythema.  Eyes:     Extraocular Movements: Extraocular movements intact.     Conjunctiva/sclera: Conjunctivae normal.     Pupils: Pupils are equal, round, and reactive to light.  Cardiovascular:     Rate and Rhythm: Normal rate and regular rhythm.     Heart sounds: Normal heart sounds, S1 normal and S2 normal. No murmur heard. Pulmonary:     Effort: Pulmonary effort is normal. No accessory muscle usage or respiratory distress.     Breath sounds: Normal breath sounds. No stridor. No wheezing, rhonchi or rales.     Comments: Clear to auscultation bilaterally Abdominal:     General: Bowel sounds are normal.     Palpations: Abdomen is soft.     Tenderness: There is no abdominal tenderness.  Neurological:     General: No focal deficit present.     Mental Status: He is alert and oriented to person, place, and time.     Cranial Nerves: Cranial nerves 2-12 are intact.     Motor: Motor function is intact.     Coordination: Coordination is intact.     Gait: Gait is intact.  Psychiatric:        Behavior: Behavior is cooperative.      UC Treatments / Results  Labs (all labs ordered are listed, but only abnormal results are displayed) Labs Reviewed  CBG MONITORING, ED - Abnormal; Notable for the following components:      Result Value   Glucose-Capillary 154 (*)    All other components within normal limits  POCT URINALYSIS DIPSTICK, ED / UC - Abnormal; Notable for the following components:   Hgb urine dipstick TRACE (*)    All other  components within normal limits  SARS CORONAVIRUS 2 (TAT 6-24 HRS)  POC INFLUENZA A AND B ANTIGEN (URGENT CARE ONLY)    EKG   Radiology No results found.  Procedures Procedures (including critical care time)  Medications Ordered in UC Medications - No data to display  Initial Impression / Assessment and Plan / UC Course  I have reviewed the triage vital signs and the nursing notes.  Pertinent labs & imaging results that were available during my care of the patient were reviewed by me and considered in my medical decision making (see chart for details).     Initially was going to discharge patient home as he was negative for influenza and had normal blood sugar.  EKG was obtained given dizziness which showed normal sinus rhythm with ventricular rate of 77 bpm; no previous to compare.  On reevaluation at the time of discharge patient reports that his dizziness had worsened and he is still unable to stand and steady himself.  Given acute onset of dizziness of this severity recommended that he get a head CT as he does have several risk factors for cardiovascular disease.  We do not have these capabilities in urgent care and recommend he go to the emergency room.  He was unaccompanied to his visit today but was agreeable to go to the ER.  [facility] was called to transport him to the emergency room.    As we are waiting for [facility] patient reported that he was feeling much better.  He is no longer feeling dizzy and did not want to go to the emergency room.  He did request that we check for anemia but discussed that our lab tech had already left and we did not draw labs as we were expecting him to go to the emergency room.  He can return tomorrow we can draw some basic labs.  We discussed that if he has recurrent dizziness or worsening symptoms he must go to the emergency room immediately to which he expressed understanding.  Again discussed the safe thing to do was to go to the ER but he again  declined this as his symptoms had resolved.  Final Clinical Impressions(s) / UC Diagnoses   Final diagnoses:  Upper respiratory tract infection, unspecified type  Vertigo  Acute severe vertigo     Discharge Instructions      ??? ???? ????? ???? ????? ??????????. ??? ???? ?? ??? ???? ????? ???? ??????? ??COVID. ???? ?? ?????? ???? ?????? ?? ???????. ?????? ?????????? ??? 3 ???? ?????? ????? ??????. ???????? ?? ??????? ??????? ?????? ??. ???? ????? ?? ???? ???? ???? ??????. ??? ??? ???? ?? ????? ?????? ??? ?? ??? ??? ?? ??????? ???? ?? ?????? ??????? ????? ??? ??? ???????? ????????? ???????? / ?????? ???? ????? ?????? ??? ???? ??????? ??? ?????. laqad ja'at natijat fahsika salbiat lil'anfiluinza. sawf natasil bik 'iidha kanat natijat fahsik 'iijabiat liCOVID. ta'akad min alraahat washurib alkathir min alsawayil. aistakhdam almiklizayn hataa 3 maraat ywmyan lieilaj aldawkhati. almutabieat mae alrieayat al'awaliat alkhasat bika. nisbat alsukar fi aldam ladayk kanat munasabatan. 'iidha kan ladayk 'ayu 'aerad 'iidafiat bima fi dhalik diq fi altanafusi, wa'alam fi alsadri, walshueur waka'anak ealaa washk al'iighma'i, wal'iighma'u, walghathayan / alqay', fa'ant bihajat lildhahab 'iilaa ghurfat altawari ealaa alfur.     ED Prescriptions     Medication Sig Dispense Auth. Provider   meclizine (ANTIVERT) 25 MG tablet Take 1 tablet (25 mg total) by mouth 3 (three) times daily as needed for dizziness. 30 tablet Alka Falwell K, PA-C   benzonatate (TESSALON) 100 MG capsule Take 1 capsule (100 mg total) by mouth every 8 (eight) hours. 21 capsule Cayde Held K, PA-C   fluticasone (FLONASE) 50 MCG/ACT nasal spray Place 1 spray into both nostrils daily. 16 g Soriyah Osberg K, PA-C  PDMP not reviewed this encounter.   Terrilee Croak, PA-C 03/02/23 1956    Grizel Vesely, Derry Skill, PA-C 03/02/23 2030

## 2023-03-02 NOTE — Discharge Instructions (Signed)
??? ???? ????? ???? ????? ??????????. ??? ???? ?? ??? ???? ????? ???? ??????? ??  COVID. ???? ?? ?????? ???? ?????? ?? ???????. ?????? ?????????? ??? 3 ???? ?????? ????? ??????. ???????? ?? ??????? ??????? ?????? ??. ???? ????? ?? ???? ???? ???? ??????. ??? ??? ???? ?? ????? ?????? ??? ?? ??? ??? ?? ??????? ???? ?? ?????? ??????? ????? ??? ??? ???????? ????????? ???????? / ?????? ???? ????? ?????? ??? ???? ??????? ??? ?????. laqad ja'at natijat fahsika salbiat lil'anfiluinza. sawf natasil bik 'iidha kanat natijat fahsik 'iijabiat liCOVID. ta'akad min alraahat washurib alkathir min alsawayil. aistakhdam almiklizayn hataa 3 maraat ywmyan lieilaj aldawkhati. almutabieat mae alrieayat al'awaliat alkhasat bika. nisbat alsukar fi aldam ladayk kanat munasabatan. 'iidha kan ladayk 'ayu 'aerad 'iidafiat bima fi dhalik diq fi altanafusi, wa'alam fi alsadri, wals[place] waka'anak ealaa washk al'iighma'i, wal'iighma'u, walghathayan / alqay', fa'ant bihajat lildhahab 'iilaa ghurfat altawari ealaa alfur.

## 2023-03-02 NOTE — ED Triage Notes (Signed)
Pt states he was at work and started having SOB, ear pain in left ear, sore throat. He states he is unable to stand about an hour ago while he was at work. He did take tylenol this morning. Pt states he is dizzy and hasn't taken his meds for 2 days since he hasn't been eating much. He did drive here from work. He states he hasn't checked his blood sugar at home he doesn't have lancets.

## 2023-03-02 NOTE — ED Notes (Signed)
Patient is being discharged from the Urgent Care and sent to the Emergency Department via [place] . Per [employer], patient is in need of higher level of care due to vertigo. Patient is aware and verbalizes understanding of plan of care.  Vitals:   03/02/23 1916  BP: (!) 151/88  Pulse: 78  Resp: 18  Temp: 98.2 F (36.8 C)  SpO2: 96%

## 2023-03-02 NOTE — ED Notes (Signed)
Provider spoke with pt and he has decided he would like to go home and come back if needed. Called [facility] to cancel

## 2023-03-03 LAB — SARS CORONAVIRUS 2 (TAT 6-24 HRS): SARS Coronavirus 2: NEGATIVE

## 2023-03-09 ENCOUNTER — Encounter (HOSPITAL_COMMUNITY): Payer: Self-pay | Admitting: Emergency Medicine

## 2023-03-09 ENCOUNTER — Other Ambulatory Visit: Payer: Self-pay

## 2023-03-09 ENCOUNTER — Emergency Department (HOSPITAL_COMMUNITY)
Admission: EM | Admit: 2023-03-09 | Discharge: 2023-03-09 | Disposition: A | Discharge status: Home / Self Care | Payer: Medicaid Other | Attending: Emergency Medicine | Admitting: Emergency Medicine

## 2023-03-09 DIAGNOSIS — E119 Type 2 diabetes mellitus without complications: Secondary | ICD-10-CM | POA: Insufficient documentation

## 2023-03-09 DIAGNOSIS — I10 Essential (primary) hypertension: Secondary | ICD-10-CM | POA: Diagnosis not present

## 2023-03-09 DIAGNOSIS — Z7984 Long term (current) use of oral hypoglycemic drugs: Secondary | ICD-10-CM | POA: Diagnosis not present

## 2023-03-09 DIAGNOSIS — R112 Nausea with vomiting, unspecified: Secondary | ICD-10-CM | POA: Diagnosis not present

## 2023-03-09 DIAGNOSIS — R197 Diarrhea, unspecified: Secondary | ICD-10-CM | POA: Insufficient documentation

## 2023-03-09 DIAGNOSIS — R1013 Epigastric pain: Secondary | ICD-10-CM | POA: Insufficient documentation

## 2023-03-09 DIAGNOSIS — R109 Unspecified abdominal pain: Secondary | ICD-10-CM | POA: Diagnosis present

## 2023-03-09 LAB — COMPREHENSIVE METABOLIC PANEL
ALT: 15 U/L (ref 0–44)
AST: 19 U/L (ref 15–41)
Albumin: 3.7 g/dL (ref 3.5–5.0)
Alkaline Phosphatase: 67 U/L (ref 38–126)
Anion gap: 9 (ref 5–15)
BUN: 9 mg/dL (ref 8–23)
CO2: 25 mmol/L (ref 22–32)
Calcium: 8.8 mg/dL — ABNORMAL LOW (ref 8.9–10.3)
Chloride: 100 mmol/L (ref 98–111)
Creatinine, Ser: 0.96 mg/dL (ref 0.61–1.24)
GFR, Estimated: >60 mL/min (ref >60)
Glucose, Bld: 133 mg/dL — ABNORMAL HIGH (ref 70–99)
Potassium: 4.1 mmol/L (ref 3.5–5.1)
Sodium: 134 mmol/L — ABNORMAL LOW (ref 135–145)
Total Bilirubin: 0.6 mg/dL (ref 0.3–1.2)
Total Protein: 7.5 g/dL (ref 6.5–8.1)

## 2023-03-09 LAB — CBC WITH DIFFERENTIAL/PLATELET
Abs Immature Granulocytes: 0.05 10*3/uL (ref 0.00–0.07)
Basophils Absolute: 0 10*3/uL (ref 0.0–0.1)
Basophils Relative: 0 %
Eosinophils Absolute: 0.1 10*3/uL (ref 0.0–0.5)
Eosinophils Relative: 1 %
HCT: 45.4 % (ref 39.0–52.0)
Hemoglobin: 14.2 g/dL (ref 13.0–17.0)
Immature Granulocytes: 1 %
Lymphocytes Relative: 16 %
Lymphs Abs: 1.5 10*3/uL (ref 0.7–4.0)
MCH: 20.9 pg — ABNORMAL LOW (ref 26.0–34.0)
MCHC: 31.3 g/dL (ref 30.0–36.0)
MCV: 67 fL — ABNORMAL LOW (ref 80.0–100.0)
Monocytes Absolute: 0.8 10*3/uL (ref 0.1–1.0)
Monocytes Relative: 9 %
Neutro Abs: 6.6 10*3/uL (ref 1.7–7.7)
Neutrophils Relative %: 73 %
Platelets: 236 10*3/uL (ref 150–400)
RBC: 6.78 MIL/uL — ABNORMAL HIGH (ref 4.22–5.81)
RDW: 17.2 % — ABNORMAL HIGH (ref 11.5–15.5)
WBC: 9.1 10*3/uL (ref 4.0–10.5)
nRBC: 0 % (ref 0.0–0.2)

## 2023-03-09 LAB — MAGNESIUM: Magnesium: 1.8 mg/dL (ref 1.7–2.4)

## 2023-03-09 LAB — LIPASE, BLOOD: Lipase: 31 U/L (ref 11–51)

## 2023-03-09 MED ORDER — ALUM & MAG HYDROXIDE-SIMETH 200-200-20 MG/5ML PO SUSP
15.0000 mL | Freq: Once | ORAL | Status: AC
  Administered 2023-03-09: 15 mL via ORAL
  Filled 2023-03-09: qty 30

## 2023-03-09 MED ORDER — FAMOTIDINE IN NACL 20-0.9 MG/50ML-% IV SOLN
20.0000 mg | Freq: Once | INTRAVENOUS | Status: AC
  Administered 2023-03-09: 20 mg via INTRAVENOUS
  Filled 2023-03-09: qty 50

## 2023-03-09 MED ORDER — ONDANSETRON HCL 4 MG PO TABS: 4.0000 mg | ORAL_TABLET | Freq: Three times a day (TID) | ORAL | 0 refills | Status: AC | PRN

## 2023-03-09 MED ORDER — ONDANSETRON HCL 4 MG/2ML IJ SOLN
4.0000 mg | Freq: Once | INTRAMUSCULAR | Status: AC
  Administered 2023-03-09: 4 mg via INTRAVENOUS
  Filled 2023-03-09: qty 2

## 2023-03-09 NOTE — Discharge Instructions (Signed)
You were given Maalox and pepcid in the ED that helped with your belly pain. Your labs all looked normal.  You likely have food poisoning, which should improve over the next day or 2.  You can take Immodium for diarrhea as instructed on the box, you can take Maalox as needed for abdominal pain.  Both of these can be bought over-the-counter at any pharmacy.  You have been given a prescription for Zofran, which should help with your nausea.  As we discussed, this will dissolve underneath your tongue and should help you be able to eat and drink.  If you develop severe worsening abdominal pain, fever, blood in your stool or urine, intractable vomiting despite having the medicine please return to the emergency department.   ??? ?? ?????? ?????? ??????? ?? ??? ??????? ??? ????? ?? ????? ???? ?????. ???????? ???? ???? ??????. ?? ??????? ?? ???? ?????? ????? ?????? ????? ?? ??????? ?? ????? ???? ????? ?????? ?? ??????? ????????.  ????? ????? ???????? ????? ??????? ????? ????????? ???????? ??? ??????? ?????? ????? ?????? ??? ?????? ????? ???? ?????. ???? ???? ??? ??????? ???? ???? ???? ?? ?? ??????.  ??? ?? ?????? ???? ???? ????? ??????? ????? ?? ??????? ?? ????? ?? ????? ??????? ????. ??? ??????? ??? ???? ??? ??? ????? ??? ??????? ?? ?????? ??? ????? ??????.  ??? ??? ????? ?? ???? ????? ?? ????? ?? ??? ?? ?? ?? ?????? ?? ????? ?? ????? ???????? ??? ????? ?? ????? ??????? ????? ?????? ??? ??? ???????. laqad tama 'iietawuk malwks wabibsid fi qism altawari mimaa saeadak fi takhfif alam albatna. mukhtabaratik kuluha tabdu tabieiatan. min almuhtamal 'an takun msaban bitasamum ghidhayiy, waladhi min almuftarad 'an yatahasan khilal alyawm altaali 'aw alyawmayn altaaliayni. [place] tanawul 'iimudyum lieilaj al'iishal wfqan liltaelimat almawjudat ealaa aleulbati, [place] tanawul malwks hasab alhajat lieilaj alam albatni. yumkin shira' kila alnaweayn bidun wasfat tibiyat min 'ayi saydaliatin. laqad tama  'iietawuk wasfat tibiyat lidawa' zufran, waladhi min almuftarad 'an yusaeid fi takhfif alghathayan ladayka. Dustin Flock, sawf yadhub hadha taht lisanik wamin almuftarad 'an yusaeidak ealaa al'akl walsharbi. 'iidha kunt tueani min alam shadidatan fi albatn 'aw himaa 'aw dam fi Lockie Pares 'aw albawl 'aw alqay' almustaesi ealaa alraghm min tanawul aldawa'i, fayurjaa aleawdat 'iilaa qism altawarii.

## 2023-03-09 NOTE — ED Notes (Signed)
Pt provided with AVS.  Education complete; all questions answered by provider using interpreter service at bedside prior.  Pt leaving ED in stable condition at this time, ambulatory with all belongings.

## 2023-03-09 NOTE — ED Provider Triage Note (Signed)
Emergency Medicine Provider Triage Evaluation Note  Dennis Mcdaniel , a 72 y.o. male  was evaluated in triage.  Pt complains of burning periumbilical abdominal pain.  Began last night.  States he has had more than 100 episodes of diarrhea and 5 episodes of emesis since then.  Also reports painful urination.  Denies smoking, alcohol use, recreational drug use, recent travel.  Review of Systems  Positive: As above Negative: As above  Physical Exam  BP (!) 140/93 (BP Location: Right Arm)   Pulse 92   Temp 98.7 F (37.1 C)   Resp 18   Ht 5\' 6"  (1.676 m)   Wt 85.7 kg   SpO2 100%   BMI 30.49 kg/m  Gen:   Awake, no distress   Resp:  Normal effort  MSK:   Moves extremities without difficulty  Other:  Mild generalized abdominal TTP  Medical Decision Making  Medically screening exam initiated at 3:41 PM.  Appropriate orders placed.  Dennis Mcdaniel was informed that the remainder of the evaluation will be completed by another provider, this initial triage assessment does not replace that evaluation, and the importance of remaining in the ED until their evaluation is complete.  Abdominal pain labs ordered   Nehemiah Massed 03/09/23 1543

## 2023-03-09 NOTE — ED Provider Notes (Signed)
[name] Provider Note   CSN: [id number] Arrival date & time: 03/09/23  1518     History  Chief Complaint  Patient presents with   Abdominal Pain    Dennis Mcdaniel is a 72 y.o. T2DM on metformin, HTN presenting with abdominal pain, n/v, and diarrhea since last night. Endorses weakness. Endorses sore throat from vomiting. Pt states he has had approx 5 emesis episodes this morning.  Patient states that him and his daughter are both sick with similar symptoms and 8 possibly undercooked kebabs at a restaurant together last night. Denies fever, chest pain, shortness of breath, hematuria, hematochezia or melena, antibiotic use or hospital admission. Of note video interpreter was used for all parts of this history as well as exam.  Abdominal Pain      Home Medications Prior to Admission medications   Medication Sig Start Date End Date Taking? Authorizing Provider  ondansetron (ZOFRAN) 4 MG tablet Take 1 tablet (4 mg total) by mouth every 8 (eight) hours as needed for up to 3 days for nausea or vomiting. 03/09/23 03/12/23 Yes Bradd Canary, MD  benzonatate (TESSALON) 100 MG capsule Take 1 capsule (100 mg total) by mouth every 8 (eight) hours. 03/02/23   Raspet, Junie Panning K, PA-C  fluticasone (FLONASE) 50 MCG/ACT nasal spray Place 1 spray into both nostrils daily. 03/02/23   Raspet, Derry Skill, PA-C  meclizine (ANTIVERT) 25 MG tablet Take 1 tablet (25 mg total) by mouth 3 (three) times daily as needed for dizziness. 03/02/23   Raspet, Derry Skill, PA-C  metFORMIN (GLUCOPHAGE) 500 MG tablet Take 500 mg by mouth 2 (two) times daily. 02/12/18   [provider]      Allergies    Pork allergy and Ibuprofen    Review of Systems   See HPI  Physical Exam Updated Vital Signs BP 122/67   Pulse 69   Temp 98 F (36.7 C) (Oral)   Resp (!) 22   Ht 5\' 6"  (1.676 m)   Wt 85.7 kg   SpO2 94%   BMI 30.49 kg/m  Physical Exam Vitals and nursing note  reviewed.  Constitutional:      General: He is not in acute distress.    Appearance: He is well-developed.  HENT:     Head: Normocephalic and atraumatic.     Mouth/Throat:     Mouth: Mucous membranes are moist.     Pharynx: Oropharynx is clear.  Eyes:     Conjunctiva/sclera: Conjunctivae normal.  Cardiovascular:     Rate and Rhythm: Normal rate and regular rhythm.     Heart sounds: No murmur heard. Pulmonary:     Effort: Pulmonary effort is normal. No respiratory distress.     Breath sounds: Normal breath sounds.  Abdominal:     General: Abdomen is flat.     Palpations: Abdomen is soft.     Tenderness: There is abdominal tenderness in the epigastric area.     Hernia: No hernia is present.  Musculoskeletal:        General: No swelling.     Cervical back: Neck supple.  Skin:    General: Skin is warm and dry.     Capillary Refill: Capillary refill takes less than 2 seconds.  Neurological:     Mental Status: He is alert and oriented to person, place, and time.  Psychiatric:        Mood and Affect: Mood normal.     ED Results / Procedures /  Treatments   Labs (all labs ordered are listed, but only abnormal results are displayed) Labs Reviewed  CBC WITH DIFFERENTIAL/PLATELET - Abnormal; Notable for the following components:      Result Value   RBC 6.78 (*)    MCV 67.0 (*)    MCH 20.9 (*)    RDW 17.2 (*)    All other components within normal limits  COMPREHENSIVE METABOLIC PANEL - Abnormal; Notable for the following components:   Sodium 134 (*)    Glucose, Bld 133 (*)    Calcium 8.8 (*)    All other components within normal limits  LIPASE, BLOOD  MAGNESIUM  URINALYSIS, ROUTINE W REFLEX MICROSCOPIC    EKG None  Radiology No results found.  Procedures Procedures    Medications Ordered in ED Medications  ondansetron (ZOFRAN) injection 4 mg (4 mg Intravenous Given 03/09/23 1821)  famotidine (PEPCID) IVPB 20 mg premix (0 mg Intravenous Stopped 03/09/23 1924)   alum & mag hydroxide-simeth (MAALOX/MYLANTA) 200-200-20 MG/5ML suspension 15 mL (15 mLs Oral Given 03/09/23 1822)    ED Course/ Medical Decision Making/ A&P                             Medical Decision Making Amount and/or Complexity of Data Reviewed Labs: ordered.  Risk OTC drugs. Prescription drug management.   Patient presents hemodynamically stable and saturating well on room air.  He is afebrile at this time.  On exam he has tenderness to palpation over epigastrium.  No other acute findings.  Labs overall acutely unremarkable including no leukocytosis, no AKI and no electrolyte abnormalities.  Lipase is WNL making pancreatitis less likely.  EKG shows sinus rhythm with no ST abnormalities or signs of acute ischemia at this time.  Patient was given Pepcid as well as Maalox with significant relief of his pain.  He was also given Zofran with relief of his nausea.  Likely cause of patient's abdominal pain is a gastroenteritis due to possible food poisoning.  He was deemed safe for discharge        Final Clinical Impression(s) / ED Diagnoses Final diagnoses:  Epigastric pain  Nausea vomiting and diarrhea    Rx / DC Orders ED Discharge Orders          Ordered    ondansetron (ZOFRAN) 4 MG tablet  Every 8 hours PRN        03/09/23 2200              Bradd Canary, MD 03/09/23 2344    Carmin Muskrat, MD 03/10/23 2222

## 2023-03-09 NOTE — ED Notes (Signed)
Pt received for care at 1905.  Quietly resting in bed; respirations even and unlabored.  This RN introduced self to pt.  Bed in lowest position, wheels locked.

## 2023-03-09 NOTE — ED Triage Notes (Addendum)
Pt c/o abdominal pain, n/v, and diarrhea since last night. Endorses weakness. Endorses sore throat from vomiting. Pt states he has had approx 5 emesis episodes this morning.

## 2023-03-14 ENCOUNTER — Ambulatory Visit (HOSPITAL_COMMUNITY)
Admission: EM | Admit: 2023-03-14 | Discharge: 2023-03-14 | Disposition: A | Discharge status: Home / Self Care | Payer: Medicaid Other | Attending: Emergency Medicine | Admitting: Emergency Medicine

## 2023-03-14 ENCOUNTER — Encounter (HOSPITAL_COMMUNITY): Payer: Self-pay

## 2023-03-14 DIAGNOSIS — H6121 Impacted cerumen, right ear: Secondary | ICD-10-CM

## 2023-03-14 DIAGNOSIS — H60502 Unspecified acute noninfective otitis externa, left ear: Secondary | ICD-10-CM | POA: Diagnosis not present

## 2023-03-14 MED ORDER — AMOXICILLIN-POT CLAVULANATE 875-125 MG PO TABS: 1.0000 | ORAL_TABLET | Freq: Two times a day (BID) | ORAL | 0 refills | Status: DC

## 2023-03-14 MED ORDER — FLUTICASONE PROPIONATE 50 MCG/ACT NA SUSP: 1.0000 | Freq: Every day | NASAL | 0 refills | Status: DC

## 2023-03-14 NOTE — Discharge Instructions (Signed)
Please call the ENT office listed below for an appointment for the ringing sound in your ear.   Use the nasal spray every day until it is gone.

## 2023-03-14 NOTE — ED Triage Notes (Signed)
Per Interpreter-Nihal [id number] c/o left ear pain, dizziness, and states he can not hear out of that ear.   Patient states he last took Tylenol yesterday.

## 2023-03-14 NOTE — ED Provider Notes (Signed)
[facility]    CSN: [id number] Arrival date & time: 03/14/23  1540      History   Chief Complaint Chief Complaint  Patient presents with   Otalgia    HPI Dennis Mcdaniel is a 72 y.o. male.   This visit was conducted with the help of a video arabic interpreter.   Pt reports 5 days of L ear pain, intermittent dizziness, and feeling like he cannot hear with the L ear - it feels "blocked". Also has ringing in his Faythe Ding[place]. R ear is half as bothersome/painful. No ringing in Rear. Was sick with influenza like illness earlier this month and feels he still has some nasal congestion on the L side.    Otalgia   Past Medical History:  Diagnosis Date   BMI 31.0-31.9,adult    COVID-19    Diabetes mellitus without complication ([place])    Hypertension     There are no problems to display for this patient.   History reviewed. No pertinent surgical history.     Home Medications    Prior to Admission medications   Medication Sig Start Date End Date Taking? Authorizing Provider  amoxicillin-clavulanate (AUGMENTIN) 875-125 MG tablet Take 1 tablet by mouth 2 (two) times daily. 03/14/23  Yes Carvel Getting, NP  benzonatate (TESSALON) 100 MG capsule Take 1 capsule (100 mg total) by mouth every 8 (eight) hours. 03/02/23   Raspet, Junie Panning K, PA-C  fluticasone (FLONASE) 50 MCG/ACT nasal spray Place 1 spray into both nostrils daily. 03/14/23   Carvel Getting, NP  meclizine (ANTIVERT) 25 MG tablet Take 1 tablet (25 mg total) by mouth 3 (three) times daily as needed for dizziness. 03/02/23   Raspet, Derry Skill, PA-C  metFORMIN (GLUCOPHAGE) 500 MG tablet Take 500 mg by mouth 2 (two) times daily. 02/12/18   [provider]    Family History History reviewed. No pertinent family history.  Social History Social History   Tobacco Use   Smoking status: Former   Smokeless tobacco: Never  Scientific laboratory technician Use: Never used  Substance Use Topics   Alcohol use: Never   Drug use:  Never     Allergies   Pork allergy and Ibuprofen   Review of Systems Review of Systems  HENT:  Positive for ear pain.      Physical Exam Triage Vital Signs ED Triage Vitals  Enc Vitals Group     BP 03/14/23 1654 121/78     Pulse Rate 03/14/23 1654 71     Resp 03/14/23 1654 16     Temp 03/14/23 1654 98 F (36.7 C)     Temp Source 03/14/23 1654 Oral     SpO2 03/14/23 1654 96 %     Weight --      Height --      Head Circumference --      Peak Flow --      Pain Score 03/14/23 1655 9     Pain Loc --      Pain Edu? --      Excl. in [place]? --    No data found.  Updated Vital Signs BP 121/78 (BP Location: Right Arm)   Pulse 71   Temp 98 F (36.7 C) (Oral)   Resp 16   SpO2 96%   Visual Acuity Right Eye Distance:   Left Eye Distance:   Bilateral Distance:    Right Eye Near:   Left Eye Near:    Bilateral Near:  Physical Exam Constitutional:      Appearance: Normal appearance.  HENT:     Right Ear: Ear canal and external ear normal. There is impacted cerumen.     Left Ear: Tympanic membrane normal. Tenderness present. There is mastoid tenderness.     Ears:     Comments: Pain with manipulation of pinna, palpation of tragus. Ear canal with mild edema, no exudate.   After RN irrigated R ear to remove cerumen, I can see TM and it appears normal. No pain with movement of pinna or palpation of tragus, no ear canal edema or exudate    Nose: No congestion or rhinorrhea.  Pulmonary:     Effort: Pulmonary effort is normal.  Neurological:     Mental Status: He is alert.      UC Treatments / Results  Labs (all labs ordered are listed, but only abnormal results are displayed) Labs Reviewed - No data to display  EKG   Radiology No results found.  Procedures Procedures (including critical care time)  Medications Ordered in UC Medications - No data to display  Initial Impression / Assessment and Plan / UC Course  I have reviewed the triage vital signs and  the nursing notes.  Pertinent labs & imaging results that were available during my care of the patient were reviewed by me and considered in my medical decision making (see chart for details).    I suspect pt's dizziness is related to nasal congestion, ear congestion but I am concerned about this symptom and the ringing in his ear. Rx flonase to use daily and recommended pt see ENT.   Although skin of external L ear and mastoid area is normal, no erythema, warmth or edema, and although L ear canal is not significantly edematous, will treat for otitis externa with systemic antibiotics.   R ear canal now free of cerumen and pt reports he can hear fine out of it now.   Final Clinical Impressions(s) / UC Diagnoses   Final diagnoses:  Impacted cerumen of right ear  Acute otitis externa of left ear, unspecified type     Discharge Instructions      Please call the ENT office listed below for an appointment for the ringing sound in your ear.   Use the nasal spray every day until it is gone.    ED Prescriptions     Medication Sig Dispense Auth. Provider   fluticasone (FLONASE) 50 MCG/ACT nasal spray Place 1 spray into both nostrils daily. 16 g Carvel Getting, NP   amoxicillin-clavulanate (AUGMENTIN) 875-125 MG tablet Take 1 tablet by mouth 2 (two) times daily. 14 tablet Carvel Getting, NP      PDMP not reviewed this encounter.   Carvel Getting, NP 03/14/23 [phone]

## 2023-12-08 ENCOUNTER — Encounter (HOSPITAL_COMMUNITY): Payer: Self-pay

## 2023-12-08 ENCOUNTER — Other Ambulatory Visit: Payer: Self-pay

## 2023-12-08 ENCOUNTER — Emergency Department (HOSPITAL_COMMUNITY): Payer: Medicaid Other

## 2023-12-08 ENCOUNTER — Emergency Department (HOSPITAL_COMMUNITY)
Admission: EM | Admit: 2023-12-08 | Discharge: 2023-12-08 | Disposition: A | Discharge status: Home / Self Care | Payer: Medicaid Other | Attending: Emergency Medicine | Admitting: Emergency Medicine

## 2023-12-08 DIAGNOSIS — S39012A Strain of muscle, fascia and tendon of lower back, initial encounter: Secondary | ICD-10-CM | POA: Diagnosis not present

## 2023-12-08 DIAGNOSIS — W010XXA Fall on same level from slipping, tripping and stumbling without subsequent striking against object, initial encounter: Secondary | ICD-10-CM | POA: Insufficient documentation

## 2023-12-08 DIAGNOSIS — W19XXXA Unspecified fall, initial encounter: Secondary | ICD-10-CM

## 2023-12-08 DIAGNOSIS — M545 Low back pain, unspecified: Secondary | ICD-10-CM | POA: Diagnosis present

## 2023-12-08 DIAGNOSIS — S76011A Strain of muscle, fascia and tendon of right hip, initial encounter: Secondary | ICD-10-CM | POA: Insufficient documentation

## 2023-12-08 DIAGNOSIS — T148XXA Other injury of unspecified body region, initial encounter: Secondary | ICD-10-CM

## 2023-12-08 MED ORDER — ACETAMINOPHEN 500 MG PO TABS
1000.0000 mg | ORAL_TABLET | Freq: Once | ORAL | Status: AC
  Administered 2023-12-08: 1000 mg via ORAL
  Filled 2023-12-08: qty 2

## 2023-12-08 MED ORDER — CYCLOBENZAPRINE HCL 10 MG PO TABS
10.0000 mg | ORAL_TABLET | Freq: Once | ORAL | Status: AC
  Administered 2023-12-08: 10 mg via ORAL
  Filled 2023-12-08: qty 1

## 2023-12-08 NOTE — ED Provider Notes (Signed)
Dennis Mcdaniel AT [facility] Provider Note   CSN: [id number] Arrival date & time: 12/08/23  1547     History  Chief Complaint  Patient presents with   Marletta Lor    Dennis Mcdaniel is a 72 y.o. male.  72 year old male presents here after slipping and falling onto his back earlier today.  Patient states that he landed on his bottom.  He is having pain in his right groin and right low back.  He denies hitting his head.  No loss of consciousness.  He denies being on anticoagulation.  Patient states that he feels like he has injured his bladder from this fall.  He denies any issues with urination.  Feels that he is fully emptying his bladder.  He is also concerned about a hernia.  The history is provided by the patient.       Home Medications Prior to Admission medications   Medication Sig Start Date End Date Taking? Authorizing Provider  amoxicillin-clavulanate (AUGMENTIN) 875-125 MG tablet Take 1 tablet by mouth 2 (two) times daily. 03/14/23   Cathlyn Parsons, NP  benzonatate (TESSALON) 100 MG capsule Take 1 capsule (100 mg total) by mouth every 8 (eight) hours. 03/02/23   Raspet, Denny Peon K, PA-C  fluticasone (FLONASE) 50 MCG/ACT nasal spray Place 1 spray into both nostrils daily. 03/14/23   Cathlyn Parsons, NP  meclizine (ANTIVERT) 25 MG tablet Take 1 tablet (25 mg total) by mouth 3 (three) times daily as needed for dizziness. 03/02/23   Raspet, Noberto Retort, PA-C  metFORMIN (GLUCOPHAGE) 500 MG tablet Take 500 mg by mouth 2 (two) times daily. 02/12/18   [provider]      Allergies    Pork allergy and Ibuprofen    Review of Systems   As noted in HPI  Physical Exam Updated Vital Signs BP (!) 163/87 (BP Location: Right Arm)   Pulse 91   Temp 98.2 F (36.8 C)   Resp 18   Ht 5\' 6"  (1.676 m)   Wt 85.3 kg   SpO2 100%   BMI 30.34 kg/m  Physical Exam Vitals reviewed. Exam conducted with a chaperone present.  Constitutional:      General: He is not  in acute distress.    Appearance: Normal appearance. He is not ill-appearing, toxic-appearing or diaphoretic.  HENT:     Head: Normocephalic and atraumatic.     Nose: Nose normal.     Mouth/Throat:     Mouth: Mucous membranes are moist.  Eyes:     Conjunctiva/sclera: Conjunctivae normal.  Cardiovascular:     Rate and Rhythm: Normal rate and regular rhythm.     Pulses: Normal pulses.          Radial pulses are 2+ on the right side and 2+ on the left side.       Dorsalis pedis pulses are 2+ on the right side and 2+ on the left side.     Heart sounds: Normal heart sounds. No murmur heard.    No friction rub. No gallop.  Pulmonary:     Effort: Pulmonary effort is normal. No respiratory distress.     Breath sounds: Normal breath sounds. No wheezing, rhonchi or rales.  Abdominal:     General: There is no distension.     Palpations: Abdomen is soft.     Tenderness: There is no abdominal tenderness. There is no guarding or rebound.     Hernia: No hernia is present.  Genitourinary:  Comments: No hernia or scrotal masses noted Musculoskeletal:     Cervical back: No bony tenderness.     Thoracic back: No bony tenderness.     Lumbar back: No bony tenderness.     Right lower leg: No edema.     Left lower leg: No edema.     Comments: Lumbar paraspinal tenderness.  No tenderness to palpation of bilateral upper or lower extremities.  Pain is elicited in the right hip abductors with active hip flexion.  Skin:    General: Skin is warm and dry.  Neurological:     Mental Status: He is alert.     ED Results / Procedures / Treatments   Labs (all labs ordered are listed, but only abnormal results are displayed) Labs Reviewed - No data to display  EKG None  Radiology DG Hip Unilat W or Wo Pelvis 2-3 Views Right Result Date: 12/08/2023 CLINICAL DATA:  Right hip pain after fall EXAM: DG HIP (WITH OR WITHOUT PELVIS) 2-3V RIGHT COMPARISON:  11/29/2022 FINDINGS: There is no evidence of hip  fracture or dislocation. Mild degenerative changes of the right hip. No appreciable soft tissue abnormality. IMPRESSION: Negative. Electronically Signed   By: Duanne Guess D.O.   On: 12/08/2023 17:51   DG Lumbar Spine 2-3 Views Result Date: 12/08/2023 CLINICAL DATA:  Back pain and hip pain EXAM: LUMBAR SPINE - 2-3 VIEW COMPARISON:  04/15/2019 FINDINGS: No evidence of acute fracture or traumatic listhesis. Mild age-related spondylotic changes. IMPRESSION: No evidence of acute fracture or traumatic listhesis. Electronically Signed   By: Minerva Fester M.D.   On: 12/08/2023 17:50    Procedures Procedures    Medications Ordered in ED Medications  acetaminophen (TYLENOL) tablet 1,000 mg (1,000 mg Oral Given 12/08/23 1812)  cyclobenzaprine (FLEXERIL) tablet 10 mg (10 mg Oral Given 12/08/23 1812)    ED Course/ Medical Decision Making/ A&P                                 Medical Decision Making Risk OTC drugs. Prescription drug management.   72 year old male presents here after mechanical fall.  Not on anticoagulation.  Vitals reassuring on presentation.  On exam, patient is in no acute distress.  He has tenderness palpation in the right lumbar paraspinal region.  No pain is able to be reproduced with palpation in any of his extremities.  Although, pain is elicited in the right hip abductors when patient performs active flexion of the right hip.  Patient was concerned for potential hernia or injury to his bladder.  His abdominal exam is benign.  I did perform exam to evaluate for hernias with a chaperone present.  No hernia was appreciated.  Patient does have pain at the symphysis, which would correlate to his adductor pain with hip flexion.  Differential includes acute fracture, muscle strain.  Patient had x-rays of the pelvis and lumbar spine performed in the triage process.  I independently interpreted these images.  No evidence of acute fracture.  Patient symptoms are most consistent  with muscle sprain/strain.  He has been able to ambulate here independently.  Pain was treated with Tylenol and Flexeril.  Patient is felt to be appropriate for discharge at this time.  Discharged in stable condition.  Patient's presentation is most consistent with acute, uncomplicated illness.         Final Clinical Impression(s) / ED Diagnoses Final diagnoses:  Fall, initial encounter  Muscle strain    Rx / DC Orders ED Discharge Orders     None         Rolla Flatten, MD 12/09/23 Karle Starch, MD 12/14/23 1310

## 2023-12-08 NOTE — ED Provider Notes (Signed)
I saw and evaluated the patient, reviewed the resident's note and I agree with the findings and plan.       Arby Barrette, MD 12/08/23 [phone]

## 2023-12-08 NOTE — Discharge Instructions (Signed)
You had x-rays of your hips and back done today.  We do not see any broken bones.  I suspect your pain is because of strained muscles that were injured in your fall.  I would recommend taking 400 mg of ibuprofen every 6 hours.  You should take this with food.  If you need additional medications for pain, you can take Tylenol 1000 mg every 6 hours as needed.  Please return to the emergency department if you develop discoloration to the area, severe swelling, or any new numbness in your leg.

## 2023-12-08 NOTE — ED Triage Notes (Signed)
Patient reports he slipped today and fell backwards and complains of right hip and pelvic pain.

## 2024-11-26 ENCOUNTER — Ambulatory Visit (INDEPENDENT_AMBULATORY_CARE_PROVIDER_SITE_OTHER)

## 2024-11-26 ENCOUNTER — Encounter (HOSPITAL_COMMUNITY): Payer: Self-pay

## 2024-11-26 ENCOUNTER — Ambulatory Visit (HOSPITAL_COMMUNITY)
Admission: EM | Admit: 2024-11-26 | Discharge: 2024-11-26 | Disposition: A | Discharge status: Home / Self Care | Attending: Internal Medicine | Admitting: Internal Medicine

## 2024-11-26 DIAGNOSIS — R051 Acute cough: Secondary | ICD-10-CM

## 2024-11-26 DIAGNOSIS — R0602 Shortness of breath: Secondary | ICD-10-CM | POA: Diagnosis not present

## 2024-11-26 DIAGNOSIS — J22 Unspecified acute lower respiratory infection: Secondary | ICD-10-CM

## 2024-11-26 MED ORDER — ALBUTEROL SULFATE HFA 108 (90 BASE) MCG/ACT IN AERS: 1.0000 | INHALATION_SPRAY | Freq: Four times a day (QID) | RESPIRATORY_TRACT | 0 refills | Status: AC | PRN

## 2024-11-26 MED ORDER — AZITHROMYCIN 250 MG PO TABS: ORAL_TABLET | ORAL | 0 refills | Status: AC

## 2024-11-26 MED ORDER — PROMETHAZINE-DM 6.25-15 MG/5ML PO SYRP: 5.0000 mL | ORAL_SOLUTION | Freq: Three times a day (TID) | ORAL | 0 refills | Status: AC | PRN

## 2024-11-26 NOTE — ED Triage Notes (Signed)
 Pt c/o cough, SOB, and wheezing x5 days. States cough worse at night. States taken cough syrup with little relief.

## 2024-11-26 NOTE — Discharge Instructions (Addendum)
 Chest x-ray done today.  Final evaluation by the radiologist is still pending but on brief evaluation there is concern for possible infectious process in the bases of the lungs.  We will treat this with antibiotics.  There is also a slight enlargement of the heart on x-ray and recommend following up with primary care provider for further evaluation of this.  We will treat with the following: Azithromycin 250mg  Take 2 tablets today and the 1 tablet daily for 4 more days.  Albuterol  inhaler 1-2 puffs every 6 hours as needed for wheezing/shortness of breath. Promethazine DM 5 mL every 8 hours as needed for cough.  Use caution as this medication can cause drowsiness.  Make sure to stay hydrated by drinking plenty of water. Return to urgent care or PCP if symptoms worsen or fail to resolve.

## 2024-11-26 NOTE — ED Provider Notes (Signed)
 [facility]    CSN: [id number] Arrival date & time: 11/26/24  1904      History   Chief Complaint Chief Complaint  Patient presents with   Cough    HPI Dennis Mcdaniel is a 73 y.o. male.   73 year old male who presents to urgent care secondary to shortness of breath, cough and ear pain.  This started about 5 days ago.  The symptoms are much worse at night.  At night he has got a productive cough.  He has not had any fevers, congestion, nausea, vomiting.  He does have a tightness in his chest secondary to coughing.  He has been taking over-the-counter medication without much relief.   Cough Associated symptoms: shortness of breath   Associated symptoms: no chest pain, no chills, no ear pain, no fever, no rash and no sore throat     Past Medical History:  Diagnosis Date   BMI 31.0-31.9,adult    COVID-19    Diabetes mellitus without complication (HCC)    Hypertension     There are no active problems to display for this patient.   History reviewed. No pertinent surgical history.     Home Medications    Prior to Admission medications   Medication Sig Start Date End Date Taking? Authorizing Provider  albuterol  (VENTOLIN  HFA) 108 (90 Base) MCG/ACT inhaler Inhale 1-2 puffs into the lungs every 6 (six) hours as needed for wheezing or shortness of breath. 11/26/24  Yes Sina Lucchesi A, PA-C  azithromycin (ZITHROMAX) 250 MG tablet Take first 2 tablets together, then 1 every day until finished. 11/26/24  Yes Chrisette Man A, PA-C  promethazine-dextromethorphan (PROMETHAZINE-DM) 6.25-15 MG/5ML syrup Take 5 mLs by mouth every 8 (eight) hours as needed for cough. 11/26/24  Yes Teresa Almarie LABOR, PA-C    Family History History reviewed. No pertinent family history.  Social History Social History   Tobacco Use   Smoking status: Former   Smokeless tobacco: Never  [employer]vertising Account [employer]   Vaping status: Never Used  Substance Use Topics   Alcohol use: Never   Drug  use: Never     Allergies   Pork allergy and Ibuprofen   Review of Systems Review of Systems  Constitutional:  Negative for chills and fever.  HENT:  Negative for ear pain and sore throat.   Eyes:  Negative for pain and visual disturbance.  Respiratory:  Positive for cough, chest tightness and shortness of breath.   Cardiovascular:  Negative for chest pain and palpitations.  Gastrointestinal:  Negative for abdominal pain and vomiting.  Genitourinary:  Negative for dysuria and hematuria.  Musculoskeletal:  Negative for arthralgias and back pain.  Skin:  Negative for color change and rash.  Neurological:  Negative for seizures and syncope.  All other systems reviewed and are negative.    Physical Exam Triage Vital Signs ED Triage Vitals  Encounter Vitals Group     BP 11/26/24 2025 135/79     Girls Systolic BP Percentile --      Girls Diastolic BP Percentile --      Boys Systolic BP Percentile --      Boys Diastolic BP Percentile --      Pulse Rate 11/26/24 2025 93     Resp 11/26/24 2025 20     Temp 11/26/24 2025 98.1 F (36.7 C)     Temp Source 11/26/24 2025 Oral     SpO2 11/26/24 2025 96 %     Weight --  Height --      Head Circumference --      Peak Flow --      Pain Score 11/26/24 2020 10     Pain Loc --      Pain Education --      Exclude from Growth Chart --    No data found.  Updated Vital Signs BP 135/79 (BP Location: Left Arm)   Pulse 93   Temp 98.1 F (36.7 C) (Oral)   Resp 20   SpO2 96%   Visual Acuity Right Eye Distance:   Left Eye Distance:   Bilateral Distance:    Right Eye Near:   Left Eye Near:    Bilateral Near:     Physical Exam Vitals and nursing note reviewed.  Constitutional:      General: He is not in acute distress.    Appearance: He is well-developed.  HENT:     Head: Normocephalic and atraumatic.  Eyes:     Conjunctiva/sclera: Conjunctivae normal.  Cardiovascular:     Rate and Rhythm: Normal rate and regular rhythm.      Heart sounds: No murmur heard. Pulmonary:     Effort: Pulmonary effort is normal. No tachypnea, accessory muscle usage or respiratory distress.     Breath sounds: Examination of the right-upper field reveals wheezing. Examination of the left-upper field reveals wheezing. Examination of the right-middle field reveals decreased breath sounds and wheezing. Examination of the left-middle field reveals decreased breath sounds and wheezing. Examination of the right-lower field reveals decreased breath sounds and rhonchi. Examination of the left-lower field reveals decreased breath sounds and rhonchi. Decreased breath sounds, wheezing and rhonchi present.  Abdominal:     Palpations: Abdomen is soft.     Tenderness: There is no abdominal tenderness.  Musculoskeletal:        General: No swelling.     Cervical back: Neck supple.  Skin:    General: Skin is warm and dry.     Capillary Refill: Capillary refill takes less than 2 seconds.  Neurological:     Mental Status: He is alert.  Psychiatric:        Mood and Affect: Mood normal.      UC Treatments / Results  Labs (all labs ordered are listed, but only abnormal results are displayed) Labs Reviewed - No data to display  EKG   Radiology No results found.  Procedures Procedures (including critical care time)  Medications Ordered in UC Medications - No data to display  Initial Impression / Assessment and Plan / UC Course  I have reviewed the triage vital signs and the nursing notes.  Pertinent labs & imaging results that were available during my care of the patient were reviewed by me and considered in my medical decision making (see chart for details).     Shortness of breath - Plan: DG Chest 2 View, DG Chest 2 View  Acute cough - Plan: DG Chest 2 View, DG Chest 2 View  Lower respiratory infection   Chest x-ray done today.  Final evaluation by the radiologist is still pending but on brief evaluation there is concern for  possible infectious process in the bases of the lungs.  We will treat this with antibiotics.  There is also a slight enlargement of the heart on x-ray and recommend following up with primary care provider for further evaluation of this.  We will treat with the following: Azithromycin  250mg  Take 2 tablets today and the 1 tablet daily for 4  more days.  Albuterol  inhaler 1-2 puffs every 6 hours as needed for wheezing/shortness of breath. Promethazine  DM 5 mL every 8 hours as needed for cough.  Use caution as this medication can cause drowsiness.  Make sure to stay hydrated by drinking plenty of water. Return to urgent care or PCP if symptoms worsen or fail to resolve.   Final Clinical Impressions(s) / UC Diagnoses   Final diagnoses:  Shortness of breath  Acute cough  Lower respiratory infection     Discharge Instructions      Chest x-ray done today.  Final evaluation by the radiologist is still pending but on brief evaluation there is concern for possible infectious process in the bases of the lungs.  We will treat this with antibiotics.  There is also a slight enlargement of the heart on x-ray and recommend following up with primary care provider for further evaluation of this.  We will treat with the following: Azithromycin  250mg  Take 2 tablets today and the 1 tablet daily for 4 more days.  Albuterol  inhaler 1-2 puffs every 6 hours as needed for wheezing/shortness of breath. Promethazine  DM 5 mL every 8 hours as needed for cough.  Use caution as this medication can cause drowsiness.  Make sure to stay hydrated by drinking plenty of water. Return to urgent care or PCP if symptoms worsen or fail to resolve.       ED Prescriptions     Medication Sig Dispense Auth. Provider   azithromycin  (ZITHROMAX ) 250 MG tablet Take first 2 tablets together, then 1 every day until finished. 6 tablet Teresa Norris A, PA-C   albuterol  (VENTOLIN  HFA) 108 (90 Base) MCG/ACT inhaler Inhale 1-2 puffs into  the lungs every 6 (six) hours as needed for wheezing or shortness of breath. 6.7 g Shirlyn Savin A, PA-C   promethazine -dextromethorphan (PROMETHAZINE -DM) 6.25-15 MG/5ML syrup Take 5 mLs by mouth every 8 (eight) hours as needed for cough. 180 mL Teresa Norris LABOR, [place]      PDMP not reviewed this encounter.   Teresa Norris LABOR, PA-C 11/26/24 2049

## 2024-11-27 ENCOUNTER — Ambulatory Visit (HOSPITAL_COMMUNITY): Payer: Self-pay

## 2024-12-05 ENCOUNTER — Emergency Department (HOSPITAL_BASED_OUTPATIENT_CLINIC_OR_DEPARTMENT_OTHER)

## 2024-12-05 ENCOUNTER — Emergency Department (HOSPITAL_BASED_OUTPATIENT_CLINIC_OR_DEPARTMENT_OTHER)
Admission: EM | Admit: 2024-12-05 | Discharge: 2024-12-05 | Disposition: A | Discharge status: Home / Self Care | Attending: Emergency Medicine | Admitting: Emergency Medicine

## 2024-12-05 ENCOUNTER — Emergency Department (HOSPITAL_BASED_OUTPATIENT_CLINIC_OR_DEPARTMENT_OTHER): Admitting: Radiology

## 2024-12-05 ENCOUNTER — Other Ambulatory Visit: Payer: Self-pay

## 2024-12-05 DIAGNOSIS — J209 Acute bronchitis, unspecified: Secondary | ICD-10-CM | POA: Insufficient documentation

## 2024-12-05 DIAGNOSIS — I10 Essential (primary) hypertension: Secondary | ICD-10-CM | POA: Insufficient documentation

## 2024-12-05 DIAGNOSIS — E119 Type 2 diabetes mellitus without complications: Secondary | ICD-10-CM | POA: Insufficient documentation

## 2024-12-05 LAB — RESP PANEL BY RT-PCR (RSV, FLU A&B, COVID)  RVPGX2
Influenza A by PCR: NEGATIVE
Influenza B by PCR: NEGATIVE
Resp Syncytial Virus by PCR: NEGATIVE
SARS Coronavirus 2 by RT PCR: NEGATIVE

## 2024-12-05 LAB — TROPONIN T, HIGH SENSITIVITY
Troponin T High Sensitivity: <15 ng/L (ref 0–19)
Troponin T High Sensitivity: <15 ng/L (ref 0–19)

## 2024-12-05 LAB — BASIC METABOLIC PANEL WITH GFR
Anion gap: 11 (ref 5–15)
BUN: 11 mg/dL (ref 8–23)
CO2: 27 mmol/L (ref 22–32)
Calcium: 10 mg/dL (ref 8.9–10.3)
Chloride: 97 mmol/L — ABNORMAL LOW (ref 98–111)
Creatinine, Ser: 1.12 mg/dL (ref 0.61–1.24)
GFR, Estimated: >60 mL/min (ref >60)
Glucose, Bld: 268 mg/dL — ABNORMAL HIGH (ref 70–99)
Potassium: 4.3 mmol/L (ref 3.5–5.1)
Sodium: 135 mmol/L (ref 135–145)

## 2024-12-05 LAB — PRO BRAIN NATRIURETIC PEPTIDE: Pro Brain Natriuretic Peptide: <50 pg/mL (ref <300.0)

## 2024-12-05 LAB — CBC
HCT: 45.8 % (ref 39.0–52.0)
Hemoglobin: 14.4 g/dL (ref 13.0–17.0)
MCH: 21.1 pg — ABNORMAL LOW (ref 26.0–34.0)
MCHC: 31.4 g/dL (ref 30.0–36.0)
MCV: 67 fL — ABNORMAL LOW (ref 80.0–100.0)
Platelets: 298 K/uL (ref 150–400)
RBC: 6.84 MIL/uL — ABNORMAL HIGH (ref 4.22–5.81)
RDW: 16.8 % — ABNORMAL HIGH (ref 11.5–15.5)
WBC: 10.3 K/uL (ref 4.0–10.5)
nRBC: 0 % (ref 0.0–0.2)

## 2024-12-05 LAB — D-DIMER, QUANTITATIVE: D-Dimer, Quant: 0.4 ug{FEU}/mL (ref 0.00–0.50)

## 2024-12-05 MED ORDER — ALBUTEROL SULFATE HFA 108 (90 BASE) MCG/ACT IN AERS
2.0000 | INHALATION_SPRAY | Freq: Once | RESPIRATORY_TRACT | Status: AC
  Administered 2024-12-05: 2 via RESPIRATORY_TRACT
  Filled 2024-12-05: qty 6.7

## 2024-12-05 MED ORDER — PREDNISONE 20 MG PO TABS: 40.0000 mg | ORAL_TABLET | Freq: Every day | ORAL | 0 refills | Status: AC

## 2024-12-05 MED ORDER — DOXYCYCLINE HYCLATE 100 MG PO CAPS: 100.0000 mg | ORAL_CAPSULE | Freq: Two times a day (BID) | ORAL | 0 refills | Status: AC

## 2024-12-05 MED ORDER — IOHEXOL 350 MG/ML SOLN
75.0000 mL | Freq: Once | INTRAVENOUS | Status: AC | PRN
  Administered 2024-12-05: 75 mL via INTRAVENOUS

## 2024-12-05 MED ORDER — IPRATROPIUM-ALBUTEROL 0.5-2.5 (3) MG/3ML IN SOLN
RESPIRATORY_TRACT | Status: AC
  Filled 2024-12-05: qty 3

## 2024-12-05 MED ORDER — METHYLPREDNISOLONE SODIUM SUCC 125 MG IJ SOLR
125.0000 mg | Freq: Once | INTRAMUSCULAR | Status: AC
  Administered 2024-12-05: 125 mg via INTRAVENOUS
  Filled 2024-12-05: qty 2

## 2024-12-05 MED ORDER — DOXYCYCLINE HYCLATE 100 MG PO TABS
100.0000 mg | ORAL_TABLET | Freq: Once | ORAL | Status: AC
  Administered 2024-12-05: 100 mg via ORAL
  Filled 2024-12-05: qty 1

## 2024-12-05 MED ORDER — IPRATROPIUM-ALBUTEROL 0.5-2.5 (3) MG/3ML IN SOLN
3.0000 mL | Freq: Once | RESPIRATORY_TRACT | Status: AC
  Administered 2024-12-05: 3 mL via RESPIRATORY_TRACT

## 2024-12-05 NOTE — Discharge Instructions (Addendum)
 Continue antibiotic and steroids as prescribed.  Continue inhaler 2 to 4 puffs every 4-6 hours as needed.  Follow-up closely with your primary care doctor for recheck.  Continue monitoring your blood sugar.

## 2024-12-05 NOTE — ED Triage Notes (Signed)
 Pt POV reporting persistent chest pain and SOB, seen 12/3 for same, dx pneumonia, completed course of abx, using inhaler without improvement.

## 2024-12-05 NOTE — ED Provider Notes (Addendum)
 Dennis Mcdaniel EMERGENCY DEPARTMENT AT [facility] Provider Note   CSN: [id number] Arrival date & time: 12/05/24  8187     Patient presents with: Chest Pain   Ajmal Kathan is a 73 y.o. male.   Patient here with ongoing chest pain shortness of breath.  Was diagnosed with pneumonia recently.  Completed course of antibiotics without much improvement.  Has been using inhaler.  He has history of diabetes hypertension.  Denies any weakness numbness tingling.  Denies any fever or chills.  Denies any abdominal pain nausea vomit diarrhea.  Interpreter used  The history is provided by the patient. A language interpreter was used.       Prior to Admission medications  Medication Sig Start Date End Date Taking? Authorizing Provider  doxycycline (VIBRAMYCIN) 100 MG capsule Take 1 capsule (100 mg total) by mouth 2 (two) times daily. 12/05/24  Yes Nicholes Hibler, DO  predniSONE (DELTASONE) 20 MG tablet Take 2 tablets (40 mg total) by mouth daily for 4 days. 12/05/24 12/09/24 Yes Anyiah Coverdale, DO  albuterol  (VENTOLIN  HFA) 108 (90 Base) MCG/ACT inhaler Inhale 1-2 puffs into the lungs every 6 (six) hours as needed for wheezing or shortness of breath. 11/26/24   White, Elizabeth A, PA-C  azithromycin  (ZITHROMAX ) 250 MG tablet Take first 2 tablets together, then 1 every day until finished. 11/26/24   White, Elizabeth A, PA-C  promethazine -dextromethorphan (PROMETHAZINE -DM) 6.25-15 MG/5ML syrup Take 5 mLs by mouth every 8 (eight) hours as needed for cough. 11/26/24   White, Almarie LABOR, PA-C    Allergies: Pork allergy and Ibuprofen    Review of Systems  Updated Vital Signs BP 131/78   Pulse 97   Temp 98.9 F (37.2 C) (Oral)   Resp 18   SpO2 98%   Physical Exam Vitals and nursing note reviewed.  Constitutional:      General: He is not in acute distress.    Appearance: He is well-developed. He is not ill-appearing.  HENT:     Head: Normocephalic and atraumatic.  Eyes:      Extraocular Movements: Extraocular movements intact.     Conjunctiva/sclera: Conjunctivae normal.     Pupils: Pupils are equal, round, and reactive to light.  Cardiovascular:     Rate and Rhythm: Normal rate and regular rhythm.     Pulses:          Radial pulses are 2+ on the right side and 2+ on the left side.     Heart sounds: Normal heart sounds. No murmur heard. Pulmonary:     Effort: Pulmonary effort is normal. No respiratory distress.     Breath sounds: Normal breath sounds. No decreased breath sounds or wheezing.  Abdominal:     Palpations: Abdomen is soft.     Tenderness: There is no abdominal tenderness.  Musculoskeletal:        General: No swelling.     Cervical back: Normal range of motion and neck supple.  Skin:    General: Skin is warm and dry.     Capillary Refill: Capillary refill takes less than 2 seconds.  Neurological:     General: No focal deficit present.     Mental Status: He is alert.  Psychiatric:        Mood and Affect: Mood normal.     (all labs ordered are listed, but only abnormal results are displayed) Labs Reviewed  BASIC METABOLIC PANEL WITH GFR - Abnormal; Notable for the following components:      Result  Value   Chloride 97 (*)    Glucose, Bld 268 (*)    All other components within normal limits  CBC - Abnormal; Notable for the following components:   RBC 6.84 (*)    MCV 67.0 (*)    MCH 21.1 (*)    RDW 16.8 (*)    All other components within normal limits  RESP PANEL BY RT-PCR (RSV, FLU A&B, COVID)  [id number]  D-DIMER, QUANTITATIVE  PRO BRAIN NATRIURETIC PEPTIDE  TROPONIN T, HIGH SENSITIVITY  TROPONIN T, HIGH SENSITIVITY    EKG: EKG Interpretation Date/Time:  Friday December 05 2024 18:17:36 EST Ventricular Rate:  101 PR Interval:  132 QRS Duration:  86 QT Interval:  340 QTC Calculation: 440 R Axis:   49  Text Interpretation: Sinus tachycardia Possible Lateral infarct , age undetermined Confirmed by Ruthe Cornet [phone]) on  12/05/2024 6:19:29 PM  Radiology: CT Angio Chest PE W and/or Wo Contrast Result Date: 12/05/2024 EXAM: CTA of the Chest with contrast for PE 12/05/2024 07:17:58 PM TECHNIQUE: CTA of the chest was performed after the administration of 75 mL of iohexol (OMNIPAQUE) 350 MG/ML injection. Multiplanar reformatted images are provided for review. MIP images are provided for review. Automated exposure control, iterative reconstruction, and/or weight based adjustment of the mA/kV was utilized to reduce the radiation dose to as low as reasonably achievable. COMPARISON: Chest x-ray from earlier in the same day. CLINICAL HISTORY: Chest pain FINDINGS: PULMONARY ARTERIES: The pulmonary artery shows a normal branching pattern. No filling defect to suggest pulmonary embolism is noted. Main pulmonary artery is normal in caliber. MEDIASTINUM: The heart and pericardium demonstrate no acute abnormality. No cardiac enlargement is seen. No significant coronary calcifications are seen. The aorta shows a normal branching pattern. No aneurysmal dilatation or dissection is noted. The esophagus is within normal limits. The thoracic inlet is within normal limits. LYMPH NODES: No mediastinal, hilar or axillary lymphadenopathy. Perifissural lymph node is noted on image number 99 of series 6. No follow-up is recommended. LUNGS AND PLEURA: The lungs are well aerated bilaterally with mild emphysematous changes, particularly in the bases. Scattered small subpleural nodules are noted, measuring less than 3 mm. No follow-up is recommended. No focal infiltrate or effusion is seen. No pneumothorax. UPPER ABDOMEN: Limited images of the upper abdomen are unremarkable. SOFT TISSUES AND BONES: No acute bone or soft tissue abnormality. IMPRESSION: 1. No pulmonary embolism. 2. Scattered small subpleural nodules less than 3 mm, and a typical perifissural lymph node; no follow-up recommended per Fleischner Society Guidelines. 3. 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 Pulmonary emphysema is an independent risk factor for lung cancer. Recommend consideration for evaluation for a low-dose CT lung cancer screening program. Electronically signed by: Oneil Devonshire MD 12/05/2024 07:48 PM EST RP Workstation: [id number]   DG Chest 2 View Result Date: 12/05/2024 EXAM: 2 VIEW(S) XRAY OF THE CHEST 12/05/2024 06:31:10 PM COMPARISON: 11/26/2024 CLINICAL HISTORY: chest pain FINDINGS: LUNGS AND PLEURA: No focal pulmonary opacity. No pleural effusion. No pneumothorax. HEART AND MEDIASTINUM: No acute abnormality of the cardiac and mediastinal silhouettes. BONES AND SOFT TISSUES: Degenerative changes of the lower thoracic spine. IMPRESSION: 1. No acute cardiopulmonary process. Electronically signed by: Pinkie Pebbles MD 12/05/2024 07:16 PM EST RP Workstation: [id number]     Procedures   Medications Ordered in the ED  ipratropium-albuterol  (DUONEB) 0.5-2.5 (3) MG/3ML nebulizer solution (  Canceled Entry 12/05/24 1846)  albuterol  (VENTOLIN  HFA) 108 (90 Base) MCG/ACT inhaler 2 puff (has no administration in time range)  ipratropium-albuterol  (DUONEB) 0.5-2.5 (3) MG/3ML nebulizer solution 3 mL (3 mLs Nebulization Given 12/05/24 1826)  methylPREDNISolone sodium succinate (SOLU-MEDROL) 125 mg/2 mL injection 125 mg (125 mg Intravenous Given 12/05/24 1934)  iohexol (OMNIPAQUE) 350 MG/ML injection 75 mL (75 mLs Intravenous Contrast Given 12/05/24 1917)  doxycycline (VIBRA-TABS) tablet 100 mg (100 mg Oral Given 12/05/24 2031)                                    Medical Decision Making Amount and/or Complexity of Data Reviewed Labs: ordered. Radiology: ordered.  Risk Prescription drug management.   Dennis Mcdaniel is here with chest pain and shortness of breath diagnosed with pneumonia recently finished a course of antibiotic.  Sounds like there were several sick contacts in the home with illness but his has been somewhat prolonged.  He has no smoking  history.  He has history of diabetes and hypertension.  He is mostly having ongoing cough shortness of breath chest pain at times.  Numbness a little bit of leg swelling as well.  He denies any abdominal pain.  No nausea vomiting diarrhea.  Fever has mostly resolved.  He is not having any headache or neck pain.  Differential diagnosis could be ongoing infectious process versus heart failure process versus ACS PE.  Does not sound like he was on any steroids.  Overall we will get labs including D-dimer troponin chest x-ray.  EKG shows sinus tachycardia.  No ischemic changes.  He is got very mild edema on his legs.  He has got some wheezing on exam.  Will give breathing treatment and Solu-Medrol.  Overall lab work showed mild elevation in his blood sugar 268 but otherwise no leukocytosis anemia or electrolyte abnormality otherwise.  Troponin normal.  D-dimer normal.  proBNP is normal.  Chest x-ray showed no acute process.  I did still decide to get a CT scan to further evaluate that confirm no pulmonary embolism.  He had some pulmonary nodules that he was made aware about to maybe follow-up for repeat CT scans in the future.  He was a former smoker.  Some emphysema changes.  Ultimately he has wheezing on exam.  I think he has ongoing postviral inflammatory process.  Will conservatively keep him on antibiotic and put him on doxycycline and put him on a course of steroids.  His blood sugar is elevated.  He is on metformin.  We talked about how short course of steroids will likely increase his blood sugar and he should follow-up closely with his primary care doctor to have that evaluated and return if blood sugar gets really elevated.  But ultimately I do not think there is any major cardiac or pulmonary process going on otherwise.  Troponin was negative on repeat.  Will have him continue albuterol  treatment steroids antibiotic and close follow-up with primary care.  Told to return if symptoms worsen.  This chart was  dictated using voice recognition software.  Despite best efforts to proofread,  errors can occur which can change the documentation meaning.      Final diagnoses:  Acute bronchitis, unspecified organism    ED Discharge Orders          Ordered    doxycycline (VIBRAMYCIN) 100 MG capsule  2 times daily        12/05/24 2033    predniSONE (DELTASONE) 20 MG tablet  Daily        12/05/24 2033  Ruthe Cornet, DO 12/05/24 2033    Ruthe Cornet, DO 12/05/24 2212
